# Patient Record
Sex: Female | Born: 1965 | Race: White | Hispanic: No | Marital: Married | Smoking: Never smoker
Health system: Southern US, Community
[De-identification: ages and names within clinical notes are randomized; demographics above are authoritative.]

## PROBLEM LIST (undated history)

## (undated) DIAGNOSIS — I10 Essential (primary) hypertension: Secondary | ICD-10-CM

## (undated) DIAGNOSIS — N939 Abnormal uterine and vaginal bleeding, unspecified: Secondary | ICD-10-CM

## (undated) DIAGNOSIS — G4730 Sleep apnea, unspecified: Secondary | ICD-10-CM

## (undated) DIAGNOSIS — Z973 Presence of spectacles and contact lenses: Secondary | ICD-10-CM

---

## 2015-07-18 HISTORY — PX: OTHER SURGICAL HISTORY: SHX169

## 2017-02-01 ENCOUNTER — Encounter: Payer: Self-pay | Admitting: Family Medicine

## 2017-02-01 ENCOUNTER — Ambulatory Visit (INDEPENDENT_AMBULATORY_CARE_PROVIDER_SITE_OTHER): Payer: BC Managed Care – PPO | Admitting: Family Medicine

## 2017-02-01 DIAGNOSIS — E669 Obesity, unspecified: Secondary | ICD-10-CM | POA: Diagnosis not present

## 2017-02-01 DIAGNOSIS — Z6841 Body Mass Index (BMI) 40.0 and over, adult: Secondary | ICD-10-CM | POA: Diagnosis not present

## 2017-02-01 DIAGNOSIS — IMO0001 Reserved for inherently not codable concepts without codable children: Secondary | ICD-10-CM

## 2017-02-01 NOTE — Progress Notes (Signed)
Medical Nutrition Therapy:  Appt start time: 1330 end time:  1430.  Assessment:  Primary concerns today: Weight management.  Jody Morton is frustrated that she has not been able to lose weight.  She moved here from [place] (originally from [place]) in March 2018.  She works full-time at [employer] related to [employer].     Jody Morton's daughter died 5 yrs ago, and she has had some extended family stresses, which she believes have been a factor in her eating behaviors.  Her 25-YO son who works for Forensic psychologist at [employer]	[place], is very supportive of her efforts to be active and make healthier choices.  She is interested in [facility]'s Living Well: Managing Emotional Eating class that is offered Aug 6.    Learning Readiness: Ready; tries to make good food choices and to be reasonably active.   Usual eating pattern includes 3 meals and 1 snack per day. Frequent foods and beverages include [place], fish, chx, cheese, almond milk, oatmeal (steel cut), coffee, 1 tbslp flvrd crmr.  Avoided foods include milk (prefers alm milk).  Has ~1 alcoholic drink per wk.   Usual physical activity includes 15-20 min swim/water ex at apt pool 2-3 X wk, ~15 min walk 2-3 X wk.  Has sleep apnea; started using a CPAP in Nov 2017, and feels she now does ok for sleep; estimates 7-8 hrs/night.    24-hr recall: (Up at 5 AM) B (6 AM)-   1 c coffee, 1 tbsp flvrd crmr B (8:45 AM)-  1 c overnight oats (<1 c dry), 1 c alm milk, 6 cherries Snk ( AM)-   --- L (12 PM)-  1 chx caesar salad, 1/4 c croutons, water Snk (1:30)-  1 granola bar, water  D (6 PM)-  2 slc pepperoni pizza, water Snk ( PM)-  --- Typical day? Yes.  Except seldom eats pizza.  Most meals are home-cooked.    Progress Towards Goal(s):  In progress.   Nutritional Diagnosis:  NB-2.1 Physical inactivity As related to minimum requirements.  As evidenced by usual activity limited to ~15 min per time, 2-5 X wk.    Intervention:  Nutrition education  Handouts given  during visit include:  AVS  Goals Sheet  Demonstrated degree of understanding via:  Teach Back  Barriers to learning/adherence to lifestyle change: Exercise resistance; hasn't figured out how to work exercise into routine.   Monitoring/Evaluation:  Dietary intake, exercise, and body weight in 4 week(s).

## 2017-02-01 NOTE — Patient Instructions (Addendum)
-   You might benefit from the Living Well: Managing Emotional Eating class that starts on Mon, Aug 6, and meets the 4 Mondays in August.  The class is either from 12-1 PM or 5:30-6:30 PM:  [email or web address]  - Consider using dairy milk in your oats b/c of the protein content.  It's a good idea to get a balance of protein and carb at each meal.    - Suggestion: Decrease amount of oats, and increase (low-glycemic) fruit (e.g., berries, peach).  Goals: 1. 60 min of physical activity 3 times weekly.   2. Obtain twice as many veg's as protein or carbohydrate foods for both lunch and dinner.  Complete your Goals Sheet, and bring to follow-up in 4 weeks.  If you decide to take the class, we will probably want to schedule your appt for after the end of the classes.

## 2017-03-01 ENCOUNTER — Ambulatory Visit: Payer: BC Managed Care – PPO | Admitting: Family Medicine

## 2017-03-06 ENCOUNTER — Ambulatory Visit (INDEPENDENT_AMBULATORY_CARE_PROVIDER_SITE_OTHER): Payer: BC Managed Care – PPO | Admitting: Family Medicine

## 2017-03-06 ENCOUNTER — Encounter: Payer: Self-pay | Admitting: Family Medicine

## 2017-03-06 DIAGNOSIS — IMO0001 Reserved for inherently not codable concepts without codable children: Secondary | ICD-10-CM

## 2017-03-06 DIAGNOSIS — Z6841 Body Mass Index (BMI) 40.0 and over, adult: Secondary | ICD-10-CM | POA: Diagnosis not present

## 2017-03-06 DIAGNOSIS — E669 Obesity, unspecified: Secondary | ICD-10-CM

## 2017-03-06 NOTE — Progress Notes (Signed)
Medical Nutrition Therapy:  Appt start time: 1330 end time:  1430.  Assessment:  Primary concerns today: Weight management.  Jody Morton has been much more physically active, and has been eating more vegetables and fruit more [place] yogurt.  She has been using her Goals Sheet, which she modified to include end-of-week totals to more easily track weekly progress.  She said she feels better, and has surprised herself in how she's been able to get up early for exercise (and is getting to bed earlier).    She inquired about what her weight would need to be for her to be considered in normal BMI range.  I shared my opinion that Jody Morton has her priorities right in placing her outcome goal of improved fitness/stamina over weight loss, as the research suggests fitness is more important to overall health than simply achieving a "normal weight."  Jody Morton's wrist size indicates a very large body frame, and I doubt weight loss to a normal BMI range (145 lb at 64" = BMI of 24.9) is realistic, necessary, or even desirable for her.    It is significant that she is currently motivated to continue efforts at both better food choices and increased physical activity, and seems to be feeling good and enjoying the process.  A narrow focus on weight loss may increase frustration and ultimately derail behavior change efforts.    Jody Morton is still interested in attending [facility]'s emotional eating class, the next one of which starts Sept 10.  Although things are going well right now, she recognizes the role emotional eating sometimes plays in her food choices, and would like to be able to manage this better.    Recent physical activity includes at least 30 min swim/water exercise 3 X wk, 60 min TM and/or stair stepper 3 X wk, and ~20 min walk 3-4 X wk.  24-hr recall:  (Up at 5:30 AM; 60 min TM) Coffee with with 1 tbsp flavored creamer B (8:30 AM)-  1 c Grk yogurt, 1 c berries, 1 tbsp almonds Snk ( AM)-  --- L (12:30 PM)-  2 c  blk beans, 1/2 c corn, let, tom, 4 oz chx, salsa, 1/2 avocado, water Snk ( PM)-  --- D (6 PM)-  2 c grilled veg's, 12 small grilled shrimp, water Snk ( PM)-  --- Typical day? Yes.    Progress Towards Goal(s):  In progress.   Nutritional Diagnosis:  Progress noted on NB-2.1 Physical inactivity As related to minimum requirements.  As evidenced by usual weekly activity of >180 min, with one recent week's exercise of 480 minutes.    Intervention:  Nutrition education  Handouts given during visit include:  AVS  Goals Sheet  Demonstrated degree of understanding via:  Teach Back  Barriers to learning/adherence to lifestyle change: Exercise resistance; hasn't figured out how to work exercise into routine.   Monitoring/Evaluation:  Dietary intake, exercise, and body weight in 8 week(s).

## 2017-03-06 NOTE — Patient Instructions (Addendum)
Look for the Living Well: Managing Emotional Eating class that starts on Mon, Sept 10 at 5:30:  [email or web address].  Search for Living Well: Managing Emotional Eating.  Outcome Goals: 1. Increase stamina to be able to continue to enjoy outdoor activities with few limitations.   2. Reduce at least 1 clothing size / lose some weight.    Behavioral Goals remain the same:  1. 60 min of physical activity 3 times weekly.   2. Obtain twice as many veg's as protein or carbohydrate foods for both lunch and dinner.  Continue to complete Goals Sheet, and bring to follow-up, which we'll schedule for October, following the Emotional Eating class Sept 10-Oct 1.

## 2017-05-08 ENCOUNTER — Ambulatory Visit: Payer: BC Managed Care – PPO | Admitting: Family Medicine

## 2017-08-16 ENCOUNTER — Other Ambulatory Visit: Payer: Self-pay | Admitting: Family Medicine

## 2017-08-16 DIAGNOSIS — N939 Abnormal uterine and vaginal bleeding, unspecified: Secondary | ICD-10-CM

## 2017-08-16 DIAGNOSIS — T8332XA Displacement of intrauterine contraceptive device, initial encounter: Secondary | ICD-10-CM

## 2017-08-23 ENCOUNTER — Ambulatory Visit
Admission: RE | Admit: 2017-08-23 | Discharge: 2017-08-23 | Disposition: A | Discharge status: Home / Self Care | Payer: BC Managed Care – PPO | Source: Ambulatory Visit | Attending: Family Medicine | Admitting: Family Medicine

## 2017-08-23 DIAGNOSIS — N939 Abnormal uterine and vaginal bleeding, unspecified: Secondary | ICD-10-CM

## 2017-08-23 DIAGNOSIS — T8332XA Displacement of intrauterine contraceptive device, initial encounter: Secondary | ICD-10-CM

## 2018-02-28 ENCOUNTER — Other Ambulatory Visit: Payer: Self-pay | Admitting: Obstetrics and Gynecology

## 2018-02-28 ENCOUNTER — Other Ambulatory Visit (HOSPITAL_COMMUNITY)
Admission: RE | Admit: 2018-02-28 | Discharge: 2018-02-28 | Disposition: A | Discharge status: Home / Self Care | Payer: BC Managed Care – PPO | Source: Ambulatory Visit | Attending: Obstetrics and Gynecology | Admitting: Obstetrics and Gynecology

## 2018-02-28 DIAGNOSIS — Z01411 Encounter for gynecological examination (general) (routine) with abnormal findings: Secondary | ICD-10-CM | POA: Insufficient documentation

## 2018-03-04 LAB — CYTOLOGY - PAP
Diagnosis: UNDETERMINED — AB
HPV (WINDOPATH): DETECTED — AB
HPV 16/18/45 GENOTYPING: NEGATIVE

## 2018-07-13 IMAGING — US US PELVIS COMPLETE TRANSABD/TRANSVAG
1 series · 14 of 25 positions shown · non-contrast
Comparison: None

CLINICAL DATA: Vaginal bleeding.  IUD placed 4 years ago.



[Series 1: us pelvis complete transabd/transvag · 0.31mm/px · 55 acquisitions, 14 frames shown]
[im 1/55]
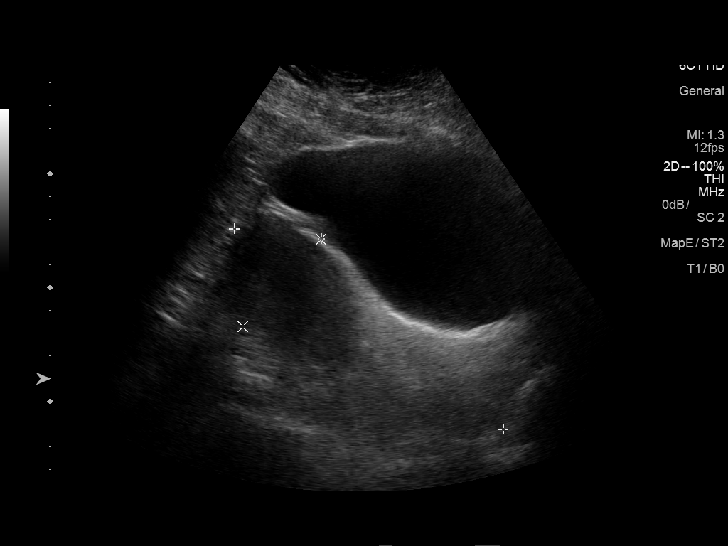
[im 5/55]
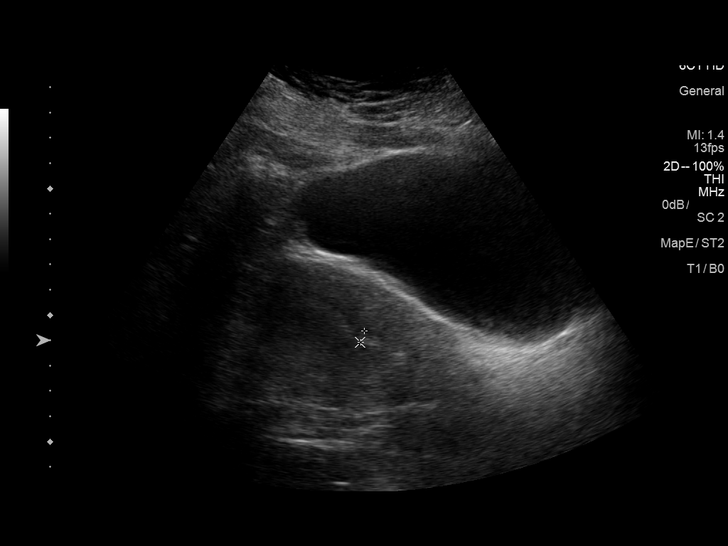
[im 10/55]
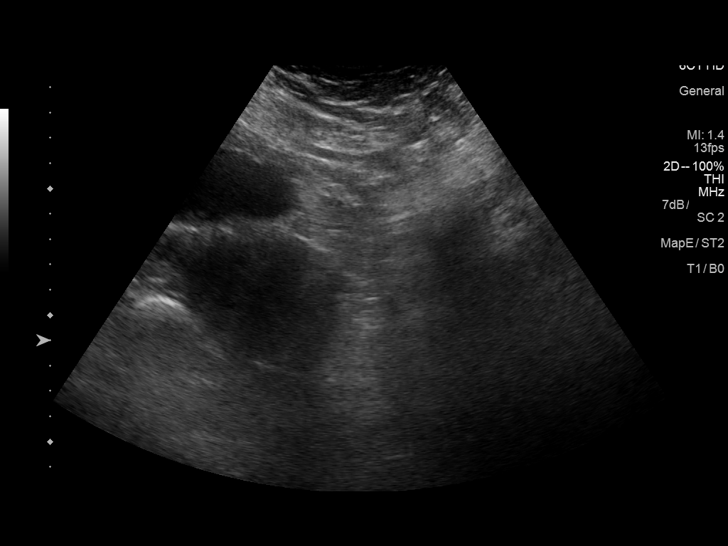
[im 14/55]
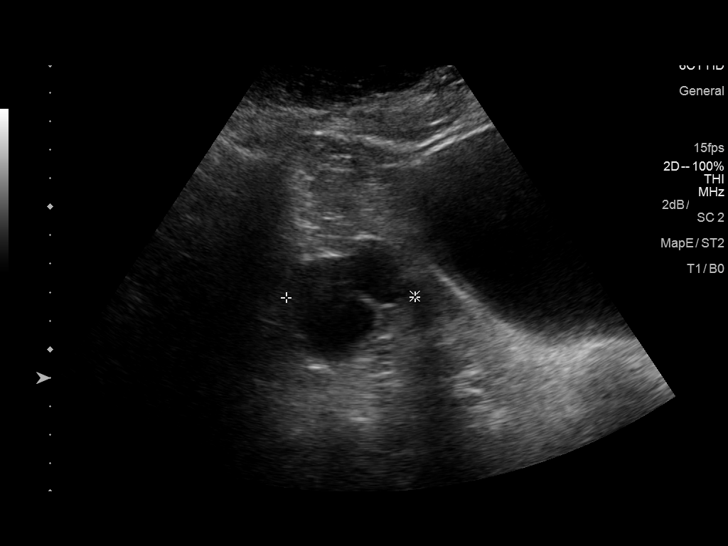
[im 19/55]
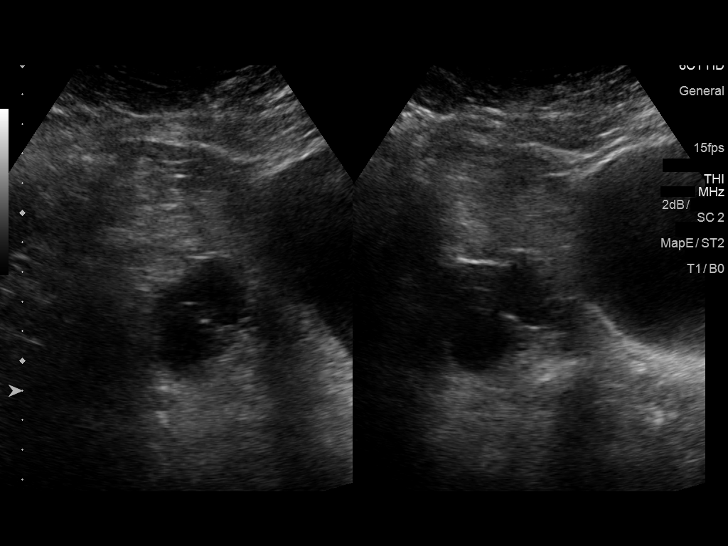
[im 21/55]
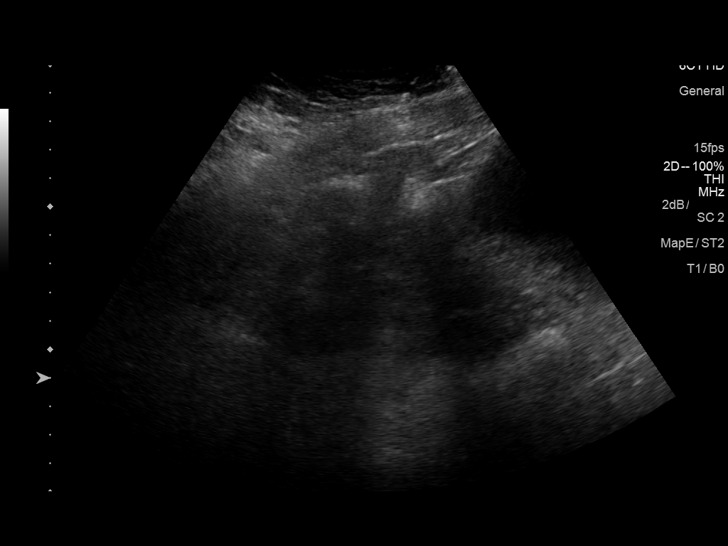
[im 25/55]
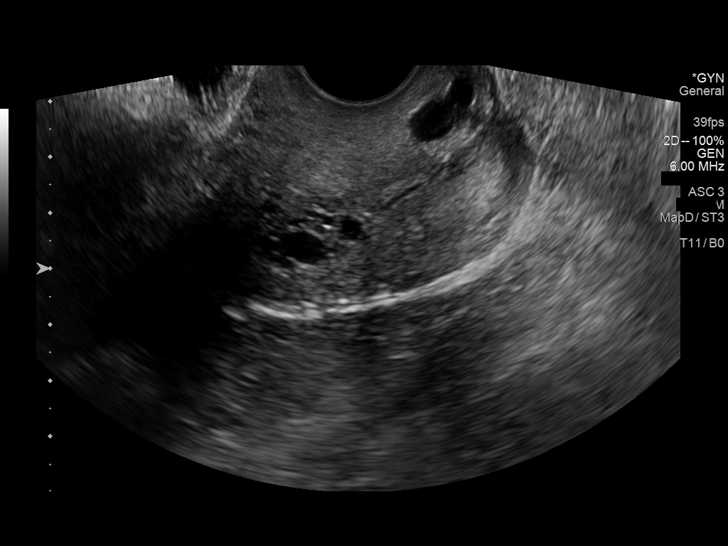
[im 30/55]
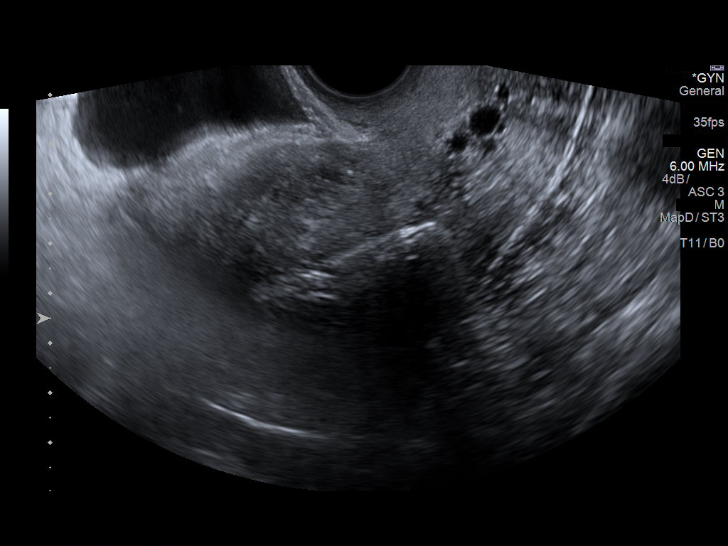
[im 34/55]
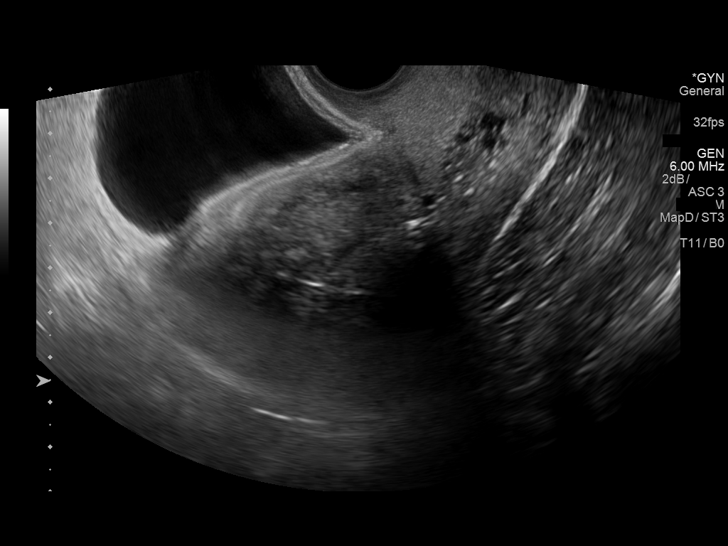
[im 37/55]
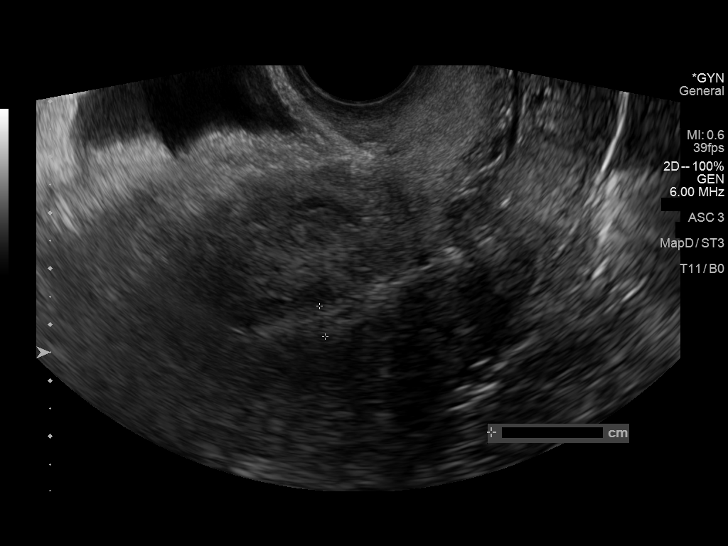
[im 41/55]
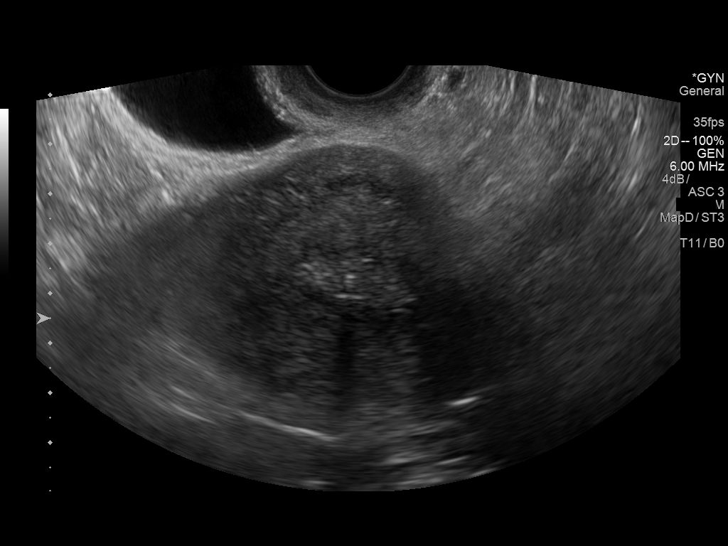
[im 46/55]
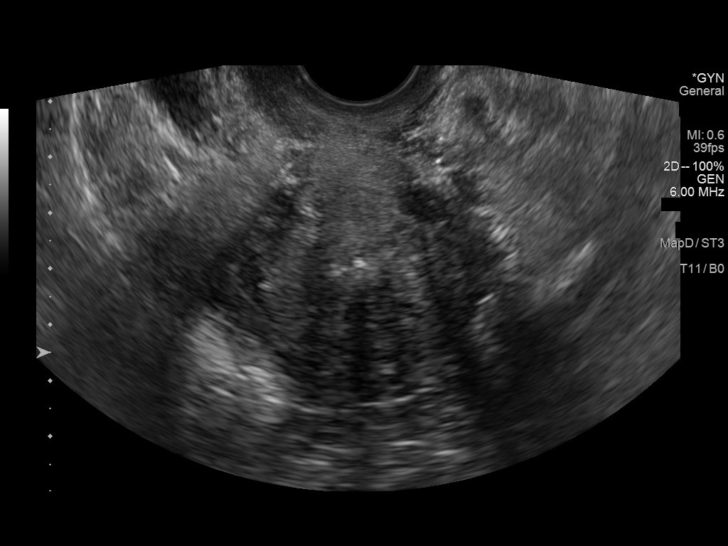
[im 50/55]
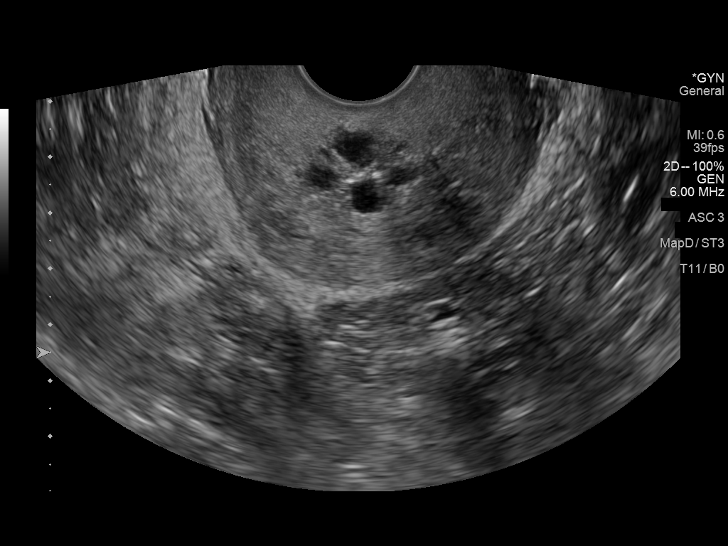
[im 55/55]
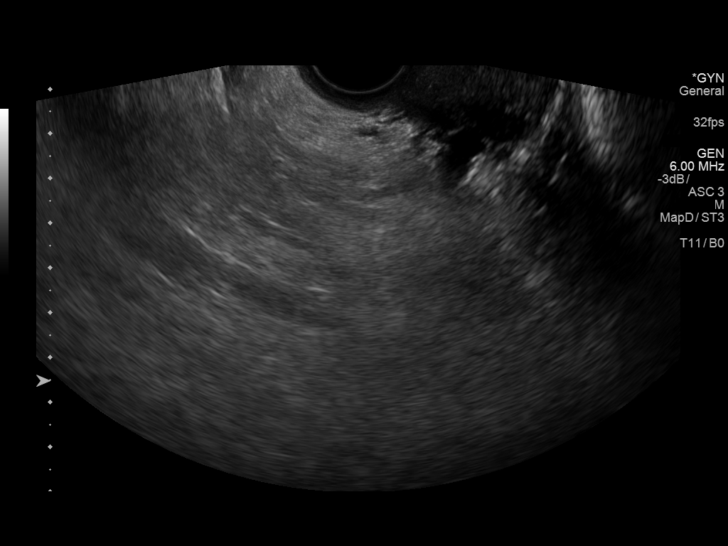

[14 of 25 positions shown; findings below may reference images not displayed]

FINDINGS: Uterus

Measurements: 10.1 x 5.2 x 8.8 cm. No fibroids or other mass
visualized.

Endometrium

Thickness: 5.6 mm. Linear echogenic artifact within the body of the
uterus likely represents an IUD.

Right ovary

Measurements: 4.5 x 4.4 x 5.4 cm. Two cystic structures on the right
ovary measure 2.9 and 2.2 cm in diameter.

Left ovary

Not seen..

Other findings

No abnormal free fluid.
IMPRESSION: Linear echogenic artifact within the endometrial canal has the
appearance of an IUD.

Two right ovarian cysts, the larger measuring 2.9 cm.

Nonvisualization of the left ovary.

## 2020-01-14 ENCOUNTER — Other Ambulatory Visit: Payer: Self-pay | Admitting: Obstetrics and Gynecology

## 2020-01-14 DIAGNOSIS — Z1231 Encounter for screening mammogram for malignant neoplasm of breast: Secondary | ICD-10-CM

## 2020-01-15 ENCOUNTER — Ambulatory Visit
Admission: RE | Admit: 2020-01-15 | Discharge: 2020-01-15 | Disposition: A | Discharge status: Home / Self Care | Payer: BC Managed Care – PPO | Source: Ambulatory Visit | Attending: Obstetrics and Gynecology | Admitting: Obstetrics and Gynecology

## 2020-01-15 ENCOUNTER — Other Ambulatory Visit: Payer: Self-pay

## 2020-01-15 DIAGNOSIS — Z1231 Encounter for screening mammogram for malignant neoplasm of breast: Secondary | ICD-10-CM

## 2020-08-16 NOTE — H&P (Signed)
Jody Morton is an 55 y.o. G2P2 with history of LSO (secondary to left ovarian cyst) and BTL who is admitted for Robotic Assisted Total Laparoscopic Hysterectomy with right salpingo-oophorectomy for abnormal uterine bleeding.  She desires definitive surgical management for her ongoing bleeding.  Has had an IUD in the past but does not desire another one. She was using Provera without success.  Her work-up has included: TVUS (02/13/20):  Uterus 12.20 x 5.63 x 1.64cm, endometrial thickness 1.05cm Fibroid 2.86cm (stable on [place] frodm 01/2019), R ovary 3.50cm, L ovary surgically absent, no free fluid or adnexal masses EMB (01/14/20):  Fragments of proliferative endometrium, focal stromal and glandular breakdown.  No hyperplasia or endometrium. Pap smear (01/14/20):  NILM (-) HRHPV CBC (01/14/2020):  H/H 12.5/37.1 HgbA1c (02/19/20):  5.3  Patient Active Problem List   Diagnosis Date Noted  . Abnormal uterine bleeding (AUB) 08/19/2020  . Obesity 08/19/2020  . History of left salpingo-oophorectomy 08/19/2020  . Hypertension 08/19/2020    MEDICAL/FAMILY/SOCIAL HX: Patient's last menstrual period was 07/06/2020.    Past Medical History:  Diagnosis Date  . Abnormal uterine bleeding (AUB)   . Hypertension   . Sleep apnea    cpcp on auto set  . Wears glasses   r  Past Surgical History:  Procedure Laterality Date  . colonscopy  2017  . tumor removed from ovary  30 yrs ago   benign    History reviewed. No pertinent family history.  Social History:  reports that she has never smoked. She has never used smokeless tobacco. She reports current alcohol use. She reports previous drug use.  ALLERGIES/MEDS:  Allergies:  Allergies  Allergen Reactions  . Tetracyclines & Related Nausea And Vomiting    No medications prior to admission.    Review of Systems  Constitutional: Negative.   HENT: Negative.   Eyes: Negative.   Respiratory: Negative.   Cardiovascular: Negative.    Gastrointestinal: Negative.   Genitourinary: Negative.   Musculoskeletal: Negative.   Skin: Negative.   Neurological: Negative.   Endo/Heme/Allergies: Negative.   Psychiatric/Behavioral: Negative.     Height 5\' 4"  (1.626 m), weight 113.4 kg, last menstrual period 07/06/2020. Gen:  NAD, pleasant and cooperative Cardio:  RRR Pulm:  CTAB, no wheezes/rales/rhonchi Abd:  Soft, non-distended, non-tender throughout Ext:  No bilateral LE edema Pelvic (08/10/20):  Normal-appearing external genitalia, vaginal mucosa pink with rugae, cervix pink, smooth, and without lesions, uterus is non-tender and mildly enlarged, no adnexal tenderness/fullness  No results found for this or any previous visit (from the past 24 hour(s)).  No results found.   ASSESSMENT/PLAN: Jody Morton is a 55 y.o. G2P2 with history of LSO (secondary to left ovarian cyst) and BTL who is admitted for Robotic Assisted Total Laparoscopic Hysterectomy with right salpingo-oophorectomy for abnormal uterine bleeding.  - Admit to [facility] - Admit labs (CBC, T&S, COVID screen) - Diet:  NPO - IVF:  Per anesthesia - VTE Prophylaxis:  SCDs - Antibiotics:  Ancef 2g on call to OR - Anticipate discharge home POD#2  Consents: I have explained to the patient that his surgery is performed to remove the uterus through several small incisions in the abdomen and that it will result in sterility.  I discussed the risks and benefits of the surgery, including, but not limited to bleeding, including the need for blood transfusion, infection, damage to surround organs and tissues, damage to bladder, damage to ureters, causing kidney damage, and requiring additional procedures, damage to bowels, resulting in further surgery, postoperative  pain, short-term and long-term, scarring on the abdominal wall and intra-abdominally, need for further surgery, need for conversion to an open procedure, development of an incisional hernia, deep vein thrombosis,  and/or pulmonary embolism, wound infection and/or separation, painful intercourse, urinary leakage, ovarian failure, resulting in menopausal symptoms requiring treatment, fistula formation, complications the course of which cannot be predicted or prevented, and death. Patient was counseled on prophylactic oophorectomy versus ovarian conservation, understanding that ovarian conservation carries a lifetime risk of ovarian cancer of 1 in 54 and a 5-10% risk of reoperation in the future for ovarian pathology.  She understands that prophylactic oophorectomy may result in menopausal symptoms.  The patient opts for oophorectomy of remaining ovary. Patient was consented for blood products.  The patient is aware that bleeding may result in the need for a blood transfusion which includes risk of transmission of HIV (1:2 million), Hepatitis C (1:2 million), and Hepatitis B (1:200 thousand) and transfusion reaction.  Patient voiced understanding of the above risks as well as understanding of indications for blood transfusion.  [name], DO [phone] (office)

## 2020-08-17 ENCOUNTER — Encounter (HOSPITAL_COMMUNITY): Admission: RE | Admit: 2020-08-17 | Payer: Self-pay | Source: Ambulatory Visit

## 2020-08-18 ENCOUNTER — Other Ambulatory Visit: Payer: Self-pay

## 2020-08-18 ENCOUNTER — Encounter (HOSPITAL_BASED_OUTPATIENT_CLINIC_OR_DEPARTMENT_OTHER): Payer: Self-pay | Admitting: Obstetrics and Gynecology

## 2020-08-18 NOTE — Progress Notes (Signed)
YOU ARE SCHEDULED FOR A COVID TEST  08-23-2020 @ 1105 AM. THIS TEST MUST BE DONE BEFORE SURGERY. GO TO  [place]. [place], [place], IT IS APPROXIMATELY 2 MINUTES PAST [name] ON THE RIGHT AND REMAIN IN YOUR CAR, THIS IS A DRIVE UP TEST. ONCE YOUR COVID TEST IS DONE PLEASE FOLLOW ALL THE QUARANTINE  INSTRUCTIONS GIVEN IN YOUR HANDOUT.      Your procedure is scheduled on  08-25-2020  Report to [facility]   Call this number if you have problems the morning of surgery  :[phone].   OUR ADDRESS IS [address].  WE ARE LOCATED IN [facility].  PLEASE BRING YOUR INSURANCE CARD AND PHOTO ID DAY OF SURGERY.  ONLY ONE PERSON ALLOWED IN FACILITY WAITING AREA.                                     REMEMBER:  DO NOT EAT FOOD, CANDY GUM OR MINTS  AFTER MIDNIGHT . YOU MAY HAVE CLEAR LIQUIDS FROM MIDNIGHT UNTIL 900 AM. NO CLEAR LIQUIDS AFTER  900 AM DAY OF SURGERY.   YOU MAY  BRUSH YOUR TEETH MORNING OF SURGERY AND RINSE YOUR MOUTH OUT, NO CHEWING GUM CANDY OR MINTS.    CLEAR LIQUID DIET   Foods Allowed                                                                     Foods Excluded  Coffee and tea, regular and decaf                             liquids that you cannot  Plain Jell-O any favor except red or purple                                           see through such as: Fruit ices (not with fruit pulp)                                     milk, soups, orange juice  Iced Popsicles                                    All solid food Carbonated beverages, regular and diet                                    Cranberry, grape and apple juices Sports drinks like Gatorade Lightly seasoned clear broth or consume(fat free) Sugar, honey syrup  Sample Menu Breakfast                                Lunch  Supper Cranberry juice                    Beef broth                            Chicken  broth Jell-O                                     Grape juice                           Apple juice Coffee or tea                        Jell-O                                      Popsicle                                                Coffee or tea                        Coffee or tea  _____________________________________________________________________     TAKE THESE MEDICATIONS MORNING OF SURGERY WITH A SIP OF WATER:  PROVERA  BRING CPAP MASK TUBING AND MACHINE  ONE VISITOR IS ALLOWED IN WAITING ROOM ONLY DAY OF SURGERY.  NO VISITOR MAY SPEND THE NIGHT.  VISITOR ARE ALLOWED TO STAY UNTIL 800 PM.                                    DO NOT WEAR JEWERLY, MAKE UP, OR NAIL POLISH ON FINGERNAILS. DO NOT WEAR LOTIONS, POWDERS, PERFUMES OR DEODORANT. DO NOT SHAVE FOR 24 HOURS PRIOR TO DAY OF SURGERY. MEN MAY SHAVE FACE AND NECK. CONTACTS, GLASSES, OR DENTURES MAY NOT BE WORN TO SURGERY.                                    [name] IS NOT RESPONSIBLE  FOR ANY BELONGINGS.                                                                    Marland Kitchen           [name] - Preparing for Surgery Before surgery, you can play an important role.  Because skin is not sterile, your skin needs to be as free of germs as possible.  You can reduce the number of germs on your skin by washing with CHG (chlorahexidine gluconate) soap before surgery.  CHG is an antiseptic cleaner which kills germs and bonds with the skin to continue killing germs even after washing. Please DO NOT use if you have an allergy  to CHG or antibacterial soaps.  If your skin becomes reddened/irritated stop using the CHG and inform your nurse when you arrive at Short Stay. Do not shave (including legs and underarms) for at least 48 hours prior to the first CHG shower.  You may shave your face/neck. Please follow these instructions carefully:  1.  Shower with CHG Soap the night before surgery and the  morning of Surgery.  2.  If you choose to  wash your hair, wash your hair first as usual with your  normal  shampoo.  3.  After you shampoo, rinse your hair and body thoroughly to remove the  shampoo.                           4.  Use CHG as you would any other liquid soap.  You can apply chg directly  to the skin and wash                       Gently with a scrungie or clean washcloth.  5.  Apply the CHG Soap to your body ONLY FROM THE NECK DOWN.   Do not use on face/ open                           Wound or open sores. Avoid contact with eyes, ears mouth and genitals (private parts).                       Wash face,  Genitals (private parts) with your normal soap.             6.  Wash thoroughly, paying special attention to the area where your surgery  will be performed.  7.  Thoroughly rinse your body with warm water from the neck down.  8.  DO NOT shower/wash with your normal soap after using and rinsing off  the CHG Soap.                9.  Pat yourself dry with a clean towel.            10.  Wear clean pajamas.            11.  Place clean sheets on your bed the night of your first shower and do not  sleep with pets. Day of Surgery : Do not apply any lotions/deodorants the morning of surgery.  Please wear clean clothes to the hospital/surgery center.  FAILURE TO FOLLOW THESE INSTRUCTIONS MAY RESULT IN THE CANCELLATION OF YOUR SURGERY PATIENT SIGNATURE_________________________________  NURSE SIGNATURE__________________________________  ________________________________________________________________________                                                        QUESTIONS Jody Morton PRE OP NURSE PHONE [phone]

## 2020-08-18 NOTE — Progress Notes (Addendum)
Spoke w/ via phone for pre-op interview---pt Lab needs dos----   Urine poct    T & S        Lab results------lab appt 08-23-2020 1000 for cbc bmp chest xray and ekg COVID test ------08-23-2020 1100 Arrive at -------1000 am 08-25-2020 NPO after MN NO Solid Food.  Clear liquids from MN until---900 am then npo Medications to take morning of surgery -----provera Diabetic medication -----n/a Patient Special Instructions -----pt given overnight stay instructions Pre-Op special Istructions -----bring cpap mask tubing and machine day of surgery Patient verbalized understanding of instructions that were given at this phone interview. Patient denies shortness of breath, chest pain, fever, cough at this phone interview.  PT HAS NOSE RING WILL REMOVE  Needs T & S redone day of surgery due to antibodies per lab beth, order placed in epic

## 2020-08-19 DIAGNOSIS — N939 Abnormal uterine and vaginal bleeding, unspecified: Secondary | ICD-10-CM

## 2020-08-19 DIAGNOSIS — I10 Essential (primary) hypertension: Secondary | ICD-10-CM

## 2020-08-19 DIAGNOSIS — Z90721 Acquired absence of ovaries, unilateral: Secondary | ICD-10-CM

## 2020-08-19 DIAGNOSIS — E669 Obesity, unspecified: Secondary | ICD-10-CM

## 2020-08-23 ENCOUNTER — Other Ambulatory Visit (HOSPITAL_COMMUNITY)
Admission: RE | Admit: 2020-08-23 | Discharge: 2020-08-23 | Disposition: A | Discharge status: Home / Self Care | Payer: BC Managed Care – PPO | Source: Ambulatory Visit | Attending: Obstetrics and Gynecology | Admitting: Obstetrics and Gynecology

## 2020-08-23 ENCOUNTER — Ambulatory Visit (HOSPITAL_COMMUNITY)
Admission: RE | Admit: 2020-08-23 | Discharge: 2020-08-23 | Disposition: A | Discharge status: Home / Self Care | Payer: BC Managed Care – PPO | Source: Ambulatory Visit | Attending: Obstetrics and Gynecology | Admitting: Obstetrics and Gynecology

## 2020-08-23 ENCOUNTER — Other Ambulatory Visit: Payer: Self-pay

## 2020-08-23 ENCOUNTER — Encounter (HOSPITAL_COMMUNITY)
Admission: RE | Admit: 2020-08-23 | Discharge: 2020-08-23 | Disposition: A | Discharge status: Home / Self Care | Payer: BC Managed Care – PPO | Source: Ambulatory Visit | Attending: Obstetrics and Gynecology | Admitting: Obstetrics and Gynecology

## 2020-08-23 DIAGNOSIS — N870 Mild cervical dysplasia: Secondary | ICD-10-CM | POA: Diagnosis not present

## 2020-08-23 DIAGNOSIS — Z881 Allergy status to other antibiotic agents status: Secondary | ICD-10-CM | POA: Diagnosis not present

## 2020-08-23 DIAGNOSIS — Z01818 Encounter for other preprocedural examination: Secondary | ICD-10-CM | POA: Diagnosis not present

## 2020-08-23 DIAGNOSIS — D259 Leiomyoma of uterus, unspecified: Secondary | ICD-10-CM | POA: Diagnosis not present

## 2020-08-23 DIAGNOSIS — Z01810 Encounter for preprocedural cardiovascular examination: Secondary | ICD-10-CM

## 2020-08-23 DIAGNOSIS — N939 Abnormal uterine and vaginal bleeding, unspecified: Secondary | ICD-10-CM | POA: Diagnosis present

## 2020-08-23 DIAGNOSIS — Z20822 Contact with and (suspected) exposure to covid-19: Secondary | ICD-10-CM | POA: Insufficient documentation

## 2020-08-23 LAB — CBC
HCT: 39.6 % (ref 36.0–46.0)
Hemoglobin: 12.2 g/dL (ref 12.0–15.0)
MCH: 25.7 pg — ABNORMAL LOW (ref 26.0–34.0)
MCHC: 30.8 g/dL (ref 30.0–36.0)
MCV: 83.5 fL (ref 80.0–100.0)
Platelets: 297 10*3/uL (ref 150–400)
RBC: 4.74 MIL/uL (ref 3.87–5.11)
RDW: 14.8 % (ref 11.5–15.5)
WBC: 7.2 10*3/uL (ref 4.0–10.5)
nRBC: 0 % (ref 0.0–0.2)

## 2020-08-23 LAB — BASIC METABOLIC PANEL
Anion gap: 11 (ref 5–15)
BUN: 17 mg/dL (ref 6–20)
CO2: 24 mmol/L (ref 22–32)
Calcium: 9.4 mg/dL (ref 8.9–10.3)
Chloride: 104 mmol/L (ref 98–111)
Creatinine, Ser: 0.68 mg/dL (ref 0.44–1.00)
GFR, Estimated: >60 mL/min (ref >60)
Glucose, Bld: 115 mg/dL — ABNORMAL HIGH (ref 70–99)
Potassium: 4 mmol/L (ref 3.5–5.1)
Sodium: 139 mmol/L (ref 135–145)

## 2020-08-23 LAB — SARS CORONAVIRUS 2 (TAT 6-24 HRS): SARS Coronavirus 2: NEGATIVE

## 2020-08-25 ENCOUNTER — Ambulatory Visit (HOSPITAL_BASED_OUTPATIENT_CLINIC_OR_DEPARTMENT_OTHER)
Admission: RE | Admit: 2020-08-25 | Discharge: 2020-08-26 | Disposition: A | Discharge status: Home / Self Care | Payer: BC Managed Care – PPO | Attending: Obstetrics and Gynecology | Admitting: Obstetrics and Gynecology

## 2020-08-25 ENCOUNTER — Encounter (HOSPITAL_BASED_OUTPATIENT_CLINIC_OR_DEPARTMENT_OTHER): Payer: Self-pay | Admitting: Obstetrics and Gynecology

## 2020-08-25 ENCOUNTER — Ambulatory Visit (HOSPITAL_BASED_OUTPATIENT_CLINIC_OR_DEPARTMENT_OTHER): Payer: BC Managed Care – PPO | Admitting: Anesthesiology

## 2020-08-25 ENCOUNTER — Encounter (HOSPITAL_BASED_OUTPATIENT_CLINIC_OR_DEPARTMENT_OTHER)
Admission: RE | Disposition: A | Discharge status: Home / Self Care | Payer: Self-pay | Source: Home / Self Care | Attending: Obstetrics and Gynecology

## 2020-08-25 ENCOUNTER — Other Ambulatory Visit: Payer: Self-pay

## 2020-08-25 DIAGNOSIS — Z20822 Contact with and (suspected) exposure to covid-19: Secondary | ICD-10-CM | POA: Insufficient documentation

## 2020-08-25 DIAGNOSIS — D259 Leiomyoma of uterus, unspecified: Secondary | ICD-10-CM | POA: Insufficient documentation

## 2020-08-25 DIAGNOSIS — Z881 Allergy status to other antibiotic agents status: Secondary | ICD-10-CM | POA: Diagnosis not present

## 2020-08-25 DIAGNOSIS — N939 Abnormal uterine and vaginal bleeding, unspecified: Secondary | ICD-10-CM

## 2020-08-25 DIAGNOSIS — N870 Mild cervical dysplasia: Secondary | ICD-10-CM | POA: Insufficient documentation

## 2020-08-25 DIAGNOSIS — Z90721 Acquired absence of ovaries, unilateral: Secondary | ICD-10-CM

## 2020-08-25 DIAGNOSIS — I10 Essential (primary) hypertension: Secondary | ICD-10-CM

## 2020-08-25 DIAGNOSIS — E669 Obesity, unspecified: Secondary | ICD-10-CM

## 2020-08-25 DIAGNOSIS — Z9079 Acquired absence of other genital organ(s): Secondary | ICD-10-CM

## 2020-08-25 HISTORY — DX: Presence of spectacles and contact lenses: Z97.3

## 2020-08-25 HISTORY — PX: ROBOTIC ASSISTED TOTAL HYSTERECTOMY WITH BILATERAL SALPINGO OOPHERECTOMY: SHX6086

## 2020-08-25 HISTORY — DX: Abnormal uterine and vaginal bleeding, unspecified: N93.9

## 2020-08-25 HISTORY — DX: Sleep apnea, unspecified: G47.30

## 2020-08-25 HISTORY — DX: Essential (primary) hypertension: I10

## 2020-08-25 LAB — ABO/RH: ABO/RH(D): A POS

## 2020-08-25 LAB — POCT PREGNANCY, URINE: Preg Test, Ur: NEGATIVE

## 2020-08-25 SURGERY — HYSTERECTOMY, TOTAL, ROBOT-ASSISTED, LAPAROSCOPIC, WITH BILATERAL SALPINGO-OOPHORECTOMY
Anesthesia: General | Site: Abdomen

## 2020-08-25 MED ORDER — LIDOCAINE HCL (PF) 2 % IJ SOLN
INTRAMUSCULAR | Status: AC
  Filled 2020-08-25: qty 20

## 2020-08-25 MED ORDER — KETOROLAC TROMETHAMINE 30 MG/ML IJ SOLN
30.0000 mg | Freq: Once | INTRAMUSCULAR | Status: AC
  Administered 2020-08-25: 30 mg via INTRAVENOUS

## 2020-08-25 MED ORDER — LIDOCAINE 2% (20 MG/ML) 5 ML SYRINGE
INTRAMUSCULAR | Status: DC | PRN
  Administered 2020-08-25: 1.5 mg/kg/h via INTRAVENOUS
  Administered 2020-08-25: 60 mg via INTRAVENOUS

## 2020-08-25 MED ORDER — ONDANSETRON HCL 4 MG/2ML IJ SOLN: 4.0000 mg | Freq: Four times a day (QID) | INTRAMUSCULAR | Status: DC | PRN

## 2020-08-25 MED ORDER — ROCURONIUM BROMIDE 10 MG/ML (PF) SYRINGE
PREFILLED_SYRINGE | INTRAVENOUS | Status: AC
  Filled 2020-08-25: qty 20

## 2020-08-25 MED ORDER — FENTANYL CITRATE (PF) 100 MCG/2ML IJ SOLN
INTRAMUSCULAR | Status: DC | PRN
  Administered 2020-08-25: 100 ug via INTRAVENOUS
  Administered 2020-08-25 (×3): 50 ug via INTRAVENOUS

## 2020-08-25 MED ORDER — MORPHINE SULFATE (PF) 4 MG/ML IV SOLN: 1.0000 mg | INTRAVENOUS | Status: DC | PRN

## 2020-08-25 MED ORDER — SCOPOLAMINE 1 MG/3DAYS TD PT72
MEDICATED_PATCH | TRANSDERMAL | Status: AC
  Filled 2020-08-25: qty 1

## 2020-08-25 MED ORDER — CELECOXIB 200 MG PO CAPS
400.0000 mg | ORAL_CAPSULE | Freq: Once | ORAL | Status: AC
  Administered 2020-08-25: 400 mg via ORAL

## 2020-08-25 MED ORDER — LACTATED RINGERS IV SOLN
INTRAVENOUS | Status: DC
  Administered 2020-08-25: 50 mL via INTRAVENOUS

## 2020-08-25 MED ORDER — ACETAMINOPHEN 500 MG PO TABS: 1000.0000 mg | ORAL_TABLET | Freq: Four times a day (QID) | ORAL | Status: DC | PRN

## 2020-08-25 MED ORDER — CEFAZOLIN SODIUM-DEXTROSE 2-4 GM/100ML-% IV SOLN
INTRAVENOUS | Status: AC
  Filled 2020-08-25: qty 100

## 2020-08-25 MED ORDER — SODIUM CHLORIDE 0.9 % IR SOLN
Status: DC | PRN
  Administered 2020-08-25: 1000 mL

## 2020-08-25 MED ORDER — ROCURONIUM BROMIDE 10 MG/ML (PF) SYRINGE
PREFILLED_SYRINGE | INTRAVENOUS | Status: AC
  Filled 2020-08-25: qty 10

## 2020-08-25 MED ORDER — ARTIFICIAL TEARS OPHTHALMIC OINT
TOPICAL_OINTMENT | OPHTHALMIC | Status: AC
  Filled 2020-08-25: qty 3.5

## 2020-08-25 MED ORDER — KETAMINE HCL 10 MG/ML IJ SOLN
INTRAMUSCULAR | Status: DC | PRN
  Administered 2020-08-25: 50 mg via INTRAVENOUS

## 2020-08-25 MED ORDER — LACTATED RINGERS IV SOLN: INTRAVENOUS | Status: DC

## 2020-08-25 MED ORDER — PANTOPRAZOLE SODIUM 40 MG IV SOLR
INTRAVENOUS | Status: AC
  Filled 2020-08-25: qty 40

## 2020-08-25 MED ORDER — DOCUSATE SODIUM 100 MG PO CAPS: 100.0000 mg | ORAL_CAPSULE | Freq: Two times a day (BID) | ORAL | Status: DC | PRN

## 2020-08-25 MED ORDER — PROPOFOL 10 MG/ML IV BOLUS
INTRAVENOUS | Status: DC | PRN
  Administered 2020-08-25: 20 mg via INTRAVENOUS
  Administered 2020-08-25: 150 mg via INTRAVENOUS

## 2020-08-25 MED ORDER — SIMETHICONE 80 MG PO CHEW: 80.0000 mg | CHEWABLE_TABLET | Freq: Four times a day (QID) | ORAL | Status: DC | PRN

## 2020-08-25 MED ORDER — ONDANSETRON HCL 4 MG/2ML IJ SOLN
INTRAMUSCULAR | Status: AC
  Filled 2020-08-25: qty 2

## 2020-08-25 MED ORDER — MIDAZOLAM HCL 2 MG/2ML IJ SOLN
INTRAMUSCULAR | Status: AC
  Filled 2020-08-25: qty 2

## 2020-08-25 MED ORDER — PHENYLEPHRINE 40 MCG/ML (10ML) SYRINGE FOR IV PUSH (FOR BLOOD PRESSURE SUPPORT)
PREFILLED_SYRINGE | INTRAVENOUS | Status: DC | PRN
  Administered 2020-08-25: 80 ug via INTRAVENOUS
  Administered 2020-08-25: 160 ug via INTRAVENOUS
  Administered 2020-08-25: 80 ug via INTRAVENOUS

## 2020-08-25 MED ORDER — ROCURONIUM BROMIDE 10 MG/ML (PF) SYRINGE
PREFILLED_SYRINGE | INTRAVENOUS | Status: DC | PRN
  Administered 2020-08-25: 20 mg via INTRAVENOUS
  Administered 2020-08-25: 60 mg via INTRAVENOUS
  Administered 2020-08-25 (×3): 20 mg via INTRAVENOUS

## 2020-08-25 MED ORDER — ONDANSETRON HCL 4 MG/2ML IJ SOLN
INTRAMUSCULAR | Status: DC | PRN
  Administered 2020-08-25 (×2): 4 mg via INTRAVENOUS

## 2020-08-25 MED ORDER — CELECOXIB 200 MG PO CAPS
ORAL_CAPSULE | ORAL | Status: AC
  Filled 2020-08-25: qty 2

## 2020-08-25 MED ORDER — CELECOXIB 200 MG PO CAPS: 200.0000 mg | ORAL_CAPSULE | Freq: Once | ORAL | Status: AC

## 2020-08-25 MED ORDER — ACETAMINOPHEN 500 MG PO TABS: 1000.0000 mg | ORAL_TABLET | Freq: Once | ORAL | Status: AC

## 2020-08-25 MED ORDER — FENTANYL CITRATE (PF) 250 MCG/5ML IJ SOLN
INTRAMUSCULAR | Status: AC
  Filled 2020-08-25: qty 5

## 2020-08-25 MED ORDER — ACETAMINOPHEN 500 MG PO TABS
1000.0000 mg | ORAL_TABLET | Freq: Once | ORAL | Status: AC
  Administered 2020-08-25: 1000 mg via ORAL

## 2020-08-25 MED ORDER — EPHEDRINE 5 MG/ML INJ
INTRAVENOUS | Status: AC
  Filled 2020-08-25: qty 10

## 2020-08-25 MED ORDER — KETAMINE HCL 10 MG/ML IJ SOLN
INTRAMUSCULAR | Status: AC
  Filled 2020-08-25: qty 1

## 2020-08-25 MED ORDER — PHENYLEPHRINE 40 MCG/ML (10ML) SYRINGE FOR IV PUSH (FOR BLOOD PRESSURE SUPPORT)
PREFILLED_SYRINGE | INTRAVENOUS | Status: AC
  Filled 2020-08-25: qty 10

## 2020-08-25 MED ORDER — EPHEDRINE SULFATE-NACL 50-0.9 MG/10ML-% IV SOSY
PREFILLED_SYRINGE | INTRAVENOUS | Status: DC | PRN
  Administered 2020-08-25: 15 mg via INTRAVENOUS

## 2020-08-25 MED ORDER — ALBUMIN HUMAN 5 % IV SOLN: INTRAVENOUS | Status: DC | PRN

## 2020-08-25 MED ORDER — CEFAZOLIN SODIUM-DEXTROSE 2-4 GM/100ML-% IV SOLN
2.0000 g | INTRAVENOUS | Status: AC
  Administered 2020-08-25: 2 g via INTRAVENOUS

## 2020-08-25 MED ORDER — AMISULPRIDE (ANTIEMETIC) 5 MG/2ML IV SOLN: 10.0000 mg | Freq: Once | INTRAVENOUS | Status: DC | PRN

## 2020-08-25 MED ORDER — MIDAZOLAM HCL 2 MG/2ML IJ SOLN
INTRAMUSCULAR | Status: DC | PRN
  Administered 2020-08-25: 2 mg via INTRAVENOUS

## 2020-08-25 MED ORDER — ACETAMINOPHEN 500 MG PO TABS
ORAL_TABLET | ORAL | Status: AC
  Filled 2020-08-25: qty 2

## 2020-08-25 MED ORDER — IBUPROFEN 200 MG PO TABS
100.0000 mg | ORAL_TABLET | Freq: Three times a day (TID) | ORAL | Status: DC
  Administered 2020-08-26: 100 mg via ORAL

## 2020-08-25 MED ORDER — ONDANSETRON HCL 4 MG PO TABS: 4.0000 mg | ORAL_TABLET | Freq: Four times a day (QID) | ORAL | Status: DC | PRN

## 2020-08-25 MED ORDER — DEXAMETHASONE SODIUM PHOSPHATE 10 MG/ML IJ SOLN
INTRAMUSCULAR | Status: DC | PRN
  Administered 2020-08-25: 10 mg via INTRAVENOUS

## 2020-08-25 MED ORDER — DEXAMETHASONE SODIUM PHOSPHATE 10 MG/ML IJ SOLN
INTRAMUSCULAR | Status: AC
  Filled 2020-08-25: qty 1

## 2020-08-25 MED ORDER — SODIUM CHLORIDE 0.9 % IV SOLN
INTRAVENOUS | Status: DC | PRN
  Administered 2020-08-25: 20 mL

## 2020-08-25 MED ORDER — PROPOFOL 10 MG/ML IV BOLUS
INTRAVENOUS | Status: AC
  Filled 2020-08-25: qty 20

## 2020-08-25 MED ORDER — KETOROLAC TROMETHAMINE 30 MG/ML IJ SOLN
INTRAMUSCULAR | Status: AC
  Filled 2020-08-25: qty 1

## 2020-08-25 MED ORDER — SUGAMMADEX SODIUM 200 MG/2ML IV SOLN
INTRAVENOUS | Status: DC | PRN
  Administered 2020-08-25: 200 mg via INTRAVENOUS

## 2020-08-25 MED ORDER — OXYCODONE HCL 5 MG PO TABS
ORAL_TABLET | ORAL | Status: AC
  Filled 2020-08-25: qty 1

## 2020-08-25 MED ORDER — SCOPOLAMINE 1 MG/3DAYS TD PT72
1.0000 | MEDICATED_PATCH | TRANSDERMAL | Status: DC
  Administered 2020-08-25: 1.5 mg via TRANSDERMAL

## 2020-08-25 MED ORDER — OXYCODONE HCL 5 MG PO TABS
5.0000 mg | ORAL_TABLET | ORAL | Status: DC | PRN
  Administered 2020-08-25 – 2020-08-26 (×3): 5 mg via ORAL

## 2020-08-25 MED ORDER — PANTOPRAZOLE SODIUM 40 MG IV SOLR
40.0000 mg | Freq: Every day | INTRAVENOUS | Status: DC
  Administered 2020-08-25: 40 mg via INTRAVENOUS

## 2020-08-25 MED ORDER — FENTANYL CITRATE (PF) 100 MCG/2ML IJ SOLN: 25.0000 ug | INTRAMUSCULAR | Status: DC | PRN

## 2020-08-25 SURGICAL SUPPLY — 68 items
ADH SKN CLS APL DERMABOND .7 (GAUZE/BANDAGES/DRESSINGS) ×1
APL SRG 38 LTWT LNG FL B (MISCELLANEOUS) ×1
APPLICATOR ARISTA FLEXITIP XL (MISCELLANEOUS) ×2 IMPLANT
BARRIER ADHS 3X4 INTERCEED (GAUZE/BANDAGES/DRESSINGS) IMPLANT
BRR ADH 4X3 ABS CNTRL BYND (GAUZE/BANDAGES/DRESSINGS)
CATH FOLEY 3WAY  5CC 16FR (CATHETERS) ×1
CATH FOLEY 3WAY 5CC 16FR (CATHETERS) ×1 IMPLANT
CELLS DAT CNTRL 66122 CELL SVR (MISCELLANEOUS) IMPLANT
COVER BACK TABLE 60X90IN (DRAPES) ×2 IMPLANT
COVER TIP SHEARS 8 DVNC (MISCELLANEOUS) ×1 IMPLANT
COVER TIP SHEARS 8MM DA VINCI (MISCELLANEOUS) ×1
DECANTER SPIKE VIAL GLASS SM (MISCELLANEOUS) ×6 IMPLANT
DEFOGGER SCOPE WARMER CLEARIFY (MISCELLANEOUS) ×2 IMPLANT
DERMABOND ADVANCED (GAUZE/BANDAGES/DRESSINGS) ×1
DERMABOND ADVANCED .7 DNX12 (GAUZE/BANDAGES/DRESSINGS) ×1 IMPLANT
DILATOR CANAL MILEX (MISCELLANEOUS) ×2 IMPLANT
DRAPE ARM DVNC X/XI (DISPOSABLE) ×4 IMPLANT
DRAPE COLUMN DVNC XI (DISPOSABLE) ×1 IMPLANT
DRAPE DA VINCI XI ARM (DISPOSABLE) ×4
DRAPE DA VINCI XI COLUMN (DISPOSABLE) ×1
DRAPE UTILITY 15X26 TOWEL STRL (DRAPES) ×2 IMPLANT
DRAPE UTILITY XL STRL (DRAPES) ×2 IMPLANT
DURAPREP 26ML APPLICATOR (WOUND CARE) ×2 IMPLANT
ELECT REM PT RETURN 9FT ADLT (ELECTROSURGICAL) ×2
ELECTRODE REM PT RTRN 9FT ADLT (ELECTROSURGICAL) ×1 IMPLANT
GLOVE BIOGEL M 6.5 STRL (GLOVE) ×6 IMPLANT
GLOVE SURG UNDER POLY LF SZ6.5 (GLOVE) ×12 IMPLANT
HEMOSTAT ARISTA ABSORB 3G PWDR (HEMOSTASIS) ×2 IMPLANT
HOLDER FOLEY CATH W/STRAP (MISCELLANEOUS) IMPLANT
IRRIG SUCT STRYKERFLOW 2 WTIP (MISCELLANEOUS) ×2
IRRIGATION SUCT STRKRFLW 2 WTP (MISCELLANEOUS) ×1 IMPLANT
KIT TURNOVER CYSTO (KITS) ×2 IMPLANT
LEGGING LITHOTOMY PAIR STRL (DRAPES) ×2 IMPLANT
OBTURATOR OPTICAL STANDARD 8MM (TROCAR)
OBTURATOR OPTICAL STND 8 DVNC (TROCAR)
OBTURATOR OPTICALSTD 8 DVNC (TROCAR) IMPLANT
OCCLUDER COLPOPNEUMO (BALLOONS) ×2 IMPLANT
PACK ROBOT WH (CUSTOM PROCEDURE TRAY) ×2 IMPLANT
PACK ROBOTIC GOWN (GOWN DISPOSABLE) ×2 IMPLANT
PACK TRENDGUARD 450 HYBRID PRO (MISCELLANEOUS) IMPLANT
PAD OB MATERNITY 4.3X12.25 (PERSONAL CARE ITEMS) ×2 IMPLANT
PAD PREP 24X48 CUFFED NSTRL (MISCELLANEOUS) ×2 IMPLANT
PROTECTOR NERVE ULNAR (MISCELLANEOUS) ×2 IMPLANT
RTRCTR WOUND ALEXIS 18CM MED (MISCELLANEOUS)
RTRCTR WOUND ALEXIS 18CM SML (INSTRUMENTS)
SAVER CELL AAL HAEMONETICS (INSTRUMENTS) IMPLANT
SCISSORS LAP 5X45 EPIX DISP (ENDOMECHANICALS) IMPLANT
SEAL CANN UNIV 5-8 DVNC XI (MISCELLANEOUS) ×4 IMPLANT
SEAL XI 5MM-8MM UNIVERSAL (MISCELLANEOUS) ×4
SEALER VESSEL DA VINCI XI (MISCELLANEOUS)
SEALER VESSEL EXT DVNC XI (MISCELLANEOUS) IMPLANT
SET IRRIG Y TYPE TUR BLADDER L (SET/KITS/TRAYS/PACK) IMPLANT
SET TRI-LUMEN FLTR TB AIRSEAL (TUBING) ×2 IMPLANT
SUT VIC AB 0 CT1 27 (SUTURE) ×4
SUT VIC AB 0 CT1 27XBRD ANBCTR (SUTURE) ×2 IMPLANT
SUT VICRYL 0 UR6 27IN ABS (SUTURE) IMPLANT
SUT VICRYL RAPIDE 4/0 PS 2 (SUTURE) ×6 IMPLANT
SUT VLOC 180 0 9IN  GS21 (SUTURE) ×1
SUT VLOC 180 0 9IN GS21 (SUTURE) ×1 IMPLANT
TIP RUMI ORANGE 6.7MMX12CM (TIP) IMPLANT
TIP UTERINE 5.1X6CM LAV DISP (MISCELLANEOUS) IMPLANT
TIP UTERINE 6.7X10CM GRN DISP (MISCELLANEOUS) IMPLANT
TIP UTERINE 6.7X6CM WHT DISP (MISCELLANEOUS) IMPLANT
TIP UTERINE 6.7X8CM BLUE DISP (MISCELLANEOUS) ×2 IMPLANT
TOWEL OR 17X26 10 PK STRL BLUE (TOWEL DISPOSABLE) ×2 IMPLANT
TRENDGUARD 450 HYBRID PRO PACK (MISCELLANEOUS)
TROCAR PORT AIRSEAL 8X120 (TROCAR) ×2 IMPLANT
WATER STERILE IRR 1000ML POUR (IV SOLUTION) ×2 IMPLANT

## 2020-08-25 NOTE — Op Note (Addendum)
Pre Op Dx:  Abnormal uterine bleeding, Uterine fibroid Post Op Dx:  Same as pre-op Procedure:  Robotic Assisted Total Laparoscopic Hysterectomy with Right Salpingo-oophorectomy   Surgeon:  Dr. [name] Assistants:  Dr. Christophe Louis Anesthesia:  General   EBL:  100cc  IVF:  800cc crystalloid and 250cc albumin UOP:  300cc   Drains:  Foley catheter Specimen removed:  Uterus (greater than 250g), cervix, right fallopian tube and ovary - all sent to pathology  Device(s) implanted: None Case Type:  Clean-contaminated Findings:  Uterus with broad fundus and a ~3cm anterior fibroid present with a few smaller-appearing sub-centimeter fibroids.  Normal-appearing right fallopian tube and ovary.  Surgical absence of left fallopian tube and ovary. Adhesions of uterus to left pelvic sidewall. Falope rings noted bilaterally - right side removed with specimen.  Left falope ring scarred into left peritoneum and left in place. Bilateral ureters were visualized and peristalsis was noted. Complications: None Indications:  55 y.o. G2P2 with history of LSO (secondary to left ovarian cyst) and BTL with AUB refractory to medical management who desired definitive surgical management. Given her age, she opted for removal of remaining ovary. Description of each procedure:  After informed consent the patient was taken to the operating room and placed in dorsal supine position where general endotracheal anesthesia was administered and found to be adequate.  She was placed in dorsal lithotomy position with her arms tucked.  She was prepped and draped in the usual sterile fashion.  A RUMI uterine manipulator with the Koh cup and a Foley catheter were placed.  A timeout was called and the procedure confirmed.  A 19mm supraumbilical incision was made and a trochar was used to enter the abdomen directly. Three 39mm ports were placed on either side of the umbilicus under direct visualization.  A left upper quadrant 75mm port was  placed under visualization. The pelvic organs were bathed with 60cc Ropivicane.  All port sites were injected with 10cc local anesthetic.    The patient was placed in Trendelenburg position and the [employer] robotic device was docked.  Next, attention was turned to the console where the hysterectomy was performed. The right infundibulopelvic ligament divided and the fallopian tube detached from mesosalpinx and the uteroovarian anastomosis was divided. The right round ligament was then divided.  The adhesions of the uterus to the left pelvic sidewall were taken down and the left round ligament was then divided.  The anterior leaflet of the peritoneum was divided to create a bladder flap. The uterine artery and vein were skeletonized and desiccated superior to the Koh cup bilaterally. Uterine blanching was observed.  A circumferential colpotomy was created along the ridge of the Koh cup. Irrigation was performed. The vaginal cuff was then closed with V-loc suture.  The vaginal occluder was removed from the vagina. Hemostasis confirmed.  Arista was placed atop the vaginal cuff.   The [employer] Vinc[employer] robotic device was undocked and all ports were visualized.  The pneumoperitoneum was reduced completely with the assistance of th ree deep breaths and all ports withdrawn.  The skin was closed with 4-0 Vicryl in subcuticular fashion with skin glue placed atop each port site. The foley catheter was removed. The patient was returned to dorsal supine position, awakened and extubated in the OR having appeared to tolerate the procedure well.  All sponge, needle, and instrument counts were correct x 2 at the end of the case.  Disposition:  PACU  [name], DO

## 2020-08-25 NOTE — Interval H&P Note (Signed)
History and Physical Interval Note:  08/25/2020 11:47 AM  Jody Morton  has presented today for surgery, with the diagnosis of N93.9 Abnormal Uterine Bleeding.  The various methods of treatment have been discussed with the patient and family. After consideration of risks, benefits and other options for treatment, the patient has consented to  Procedure(s): XI ROBOTIC ASSISTED TOTAL HYSTERECTOMY WITH BILATERAL SALPINGO OOPHORECTOMY (Bilateral) (Right oophorectomy, patient is s/p left oophorectomy). as a surgical intervention.  The patient's history has been reviewed, patient examined, no change in status, stable for surgery.  I have reviewed the patient's chart and labs.  Questions were answered to the patient's satisfaction.     [name]

## 2020-08-25 NOTE — Transfer of Care (Signed)
Immediate Anesthesia Transfer of Care Note  Patient: Genavie Boettger  Procedure(s) Performed: Procedure(s) (LRB): XI ROBOTIC ASSISTED TOTAL HYSTERECTOMY WITH BILATERAL SALPINGECTOMY, RIGHT OOPHERECTOMY (N/A)  Patient Location: PACU  Anesthesia Type: General  Level of Consciousness: sleepy  Airway & Oxygen Therapy: Patient Spontanous Breathing and Patient connected to nasal cannula oxygen  Post-op Assessment: Report given to PACU RN and Post -op Vital signs reviewed and stable  Post vital signs: Reviewed and stable  Complications: No apparent anesthesia complications Last Vitals:  Vitals Value Taken Time  BP 112/73 08/25/20 1515  Temp 36.3 C 08/25/20 1513  Pulse 89 08/25/20 1517  Resp 15 08/25/20 1517  SpO2 99 % 08/25/20 1517  Vitals shown include unvalidated device data.  Last Pain:  Vitals:   08/25/20 1513  TempSrc:   PainSc: 0-No pain      Patients Stated Pain Goal: 6 (58/00/63 4949)  Complications: No complications documented.

## 2020-08-25 NOTE — Anesthesia Preprocedure Evaluation (Signed)
Anesthesia Evaluation  Patient identified by MRN, date of birth, ID band Patient awake    Reviewed: Allergy & Precautions, NPO status , Patient's Chart, lab work & pertinent test results  Airway Mallampati: III  TM Distance: >3 FB Neck ROM: Full    Dental  (+) Dental Advisory Given   Pulmonary sleep apnea and Continuous Positive Airway Pressure Ventilation ,    breath sounds clear to auscultation       Cardiovascular hypertension, Pt. on medications  Rhythm:Regular Rate:Normal     Neuro/Psych negative neurological ROS     GI/Hepatic negative GI ROS, Neg liver ROS,   Endo/Other  negative endocrine ROS  Renal/GU negative Renal ROS     Musculoskeletal   Abdominal   Peds  Hematology negative hematology ROS (+)   Anesthesia Other Findings   Reproductive/Obstetrics                             Anesthesia Physical Anesthesia Plan  ASA: III  Anesthesia Plan: General   Post-op Pain Management:    Induction: Intravenous  PONV Risk Score and Plan: 3 and Midazolam, Scopolamine patch - Pre-op, Dexamethasone, Ondansetron and Treatment may vary due to age or medical condition  Airway Management Planned: Oral ETT  Additional Equipment: None  Intra-op Plan:   Post-operative Plan: Extubation in OR  Informed Consent: I have reviewed the patients History and Physical, chart, labs and discussed the procedure including the risks, benefits and alternatives for the proposed anesthesia with the patient or authorized representative who has indicated his/her understanding and acceptance.     Dental advisory given  Plan Discussed with: CRNA  Anesthesia Plan Comments:         Anesthesia Quick Evaluation

## 2020-08-25 NOTE — Anesthesia Procedure Notes (Signed)
Procedure Name: Intubation Date/Time: 08/25/2020 12:09 PM Performed by: Mechele Claude, CRNA Pre-anesthesia Checklist: Patient identified, Emergency Drugs available, Suction available and Patient being monitored Patient Re-evaluated:Patient Re-evaluated prior to induction Oxygen Delivery Method: Circle system utilized Preoxygenation: Pre-oxygenation with 100% oxygen Induction Type: IV induction Ventilation: Mask ventilation without difficulty Laryngoscope Size: Mac and 3 Grade View: Grade I Tube type: Oral Tube size: 7.0 mm Number of attempts: 1 Airway Equipment and Method: Stylet and Oral airway Placement Confirmation: ETT inserted through vocal cords under direct vision,  positive ETCO2 and breath sounds checked- equal and bilateral Secured at: 21 cm Tube secured with: Tape Dental Injury: Teeth and Oropharynx as per pre-operative assessment

## 2020-08-26 DIAGNOSIS — N939 Abnormal uterine and vaginal bleeding, unspecified: Secondary | ICD-10-CM | POA: Diagnosis not present

## 2020-08-26 LAB — SURGICAL PATHOLOGY

## 2020-08-26 MED ORDER — OXYCODONE HCL 5 MG PO TABS
ORAL_TABLET | ORAL | Status: AC
  Filled 2020-08-26: qty 1

## 2020-08-26 MED ORDER — IBUPROFEN 800 MG PO TABS: 800.0000 mg | ORAL_TABLET | Freq: Three times a day (TID) | ORAL | 0 refills | Status: AC | PRN

## 2020-08-26 MED ORDER — OXYCODONE HCL 5 MG PO TABS: 5.0000 mg | ORAL_TABLET | ORAL | 0 refills | Status: AC | PRN

## 2020-08-26 MED ORDER — IBUPROFEN 200 MG PO TABS
ORAL_TABLET | ORAL | Status: AC
  Filled 2020-08-26: qty 1

## 2020-08-26 NOTE — Discharge Summary (Signed)
Physician Discharge Summary  Patient ID: Jody Morton MRN: [id number] DOB/AGE: 1966/03/26 55 y.o.  Admit date: 08/25/2020 Discharge date: 08/26/2020  Admission Diagnoses: Abnormal uterine bleeding  Discharge Diagnoses:  Active Problems:   Abnormal uterine bleeding (AUB)   Obesity   History of left salpingo-oophorectomy   Hypertension  Procedure:  Robotic Assisted Total Laparoscopic Hysterectomy with Right Salpingo-oophorectomy  Discharged Condition: stable  Hospital Course: Patient had procedure as named above and had an unremarkable post-operative course.  She was discharged home the morning after surgery.  Consults: None  Significant Diagnostic Studies: None  Treatments: surgery: As listed above  Discharge Exam: Blood pressure 99/73, pulse 100, temperature 98.9 F (37.2 C), resp. rate 16, height 5\' 4"  (1.626 m), weight 113.3 kg, last menstrual period 07/06/2020, SpO2 93 %. General appearance: alert, cooperative and appears stated age Resp: Normal work of breathing GI: Soft, non-distended, lap sites unremarkable Extremities: no edema, redness or tenderness in the calves or thighs  Disposition: Discharge disposition: 01-Home or Self Care       Discharge Instructions    Discharge patient   Complete by: As directed    Discharge disposition: 01-Home or Self Care   Discharge patient date: 08/26/2020     Allergies as of 08/26/2020      Reactions   Tetracyclines & Related Nausea And Vomiting      Medication List    STOP taking these medications   medroxyPROGESTERone 10 MG tablet Commonly known as: PROVERA     TAKE these medications   ibuprofen 800 MG tablet Commonly known as: ADVIL Take 1 tablet (800 mg total) by mouth every 8 (eight) hours as needed.   lisinopril 5 MG tablet Commonly known as: ZESTRIL Take 5 mg by mouth daily.   multivitamin with minerals Tabs tablet Take 1 tablet by mouth daily. Women's Multivitamin   oxyCODONE 5 MG immediate  release tablet Commonly known as: Oxy IR/ROXICODONE Take 1 tablet (5 mg total) by mouth every 4 (four) hours as needed for up to 5 days for moderate pain.       Follow-up Information    [name], DO Follow up in 3 week(s).   Specialty: Obstetrics and Gynecology Why: 3 week post-operative follow-up. Contact information: [address] [place] [address] [phone]               Signed: [name] 08/26/2020, 6:59 AM

## 2020-08-26 NOTE — Progress Notes (Signed)
GYN Progress Note  Subjective:  Patient feeling well.  Pain is well-controlled.  Has ambulated and voided.  Tolerating PO without N/V. Passing flatus.  Feels ready to go home today.  Denies fevers, chills, chest pain, visual changes, SOB, N/V, or bilateral LE edema.  Objective: BP 99/73 (BP Location: Right Arm)   Pulse 100   Temp 98.9 F (37.2 C)   Resp 16   Ht 5\' 4"  (1.626 m)   Wt 113.3 kg   LMP 07/06/2020   SpO2 93%   BMI 42.88 kg/m  Gen:  NAD, pleasant Pulm:  No increased WOB Abd:  Soft, non-distended, appropriately tender to palpation, no rebound/guarding, laparoscopic port sites c/d/i with skin glue atop each incision Ext:  No bilateral LE edema  A/P: POD#1 RA-TLH/RSO for AUB.  - Doing well post-operatively - Pain well-controlled - Regular diet - Spontaneous voids - Has ambulated and tolerating regular diet - Ready for D/C home today - Discharge instructions reviewed  Dispo:  D/C home today.  [name], DO

## 2020-08-26 NOTE — Anesthesia Postprocedure Evaluation (Signed)
Anesthesia Post Note  Patient: Jody Morton  Procedure(s) Performed: XI ROBOTIC ASSISTED TOTAL HYSTERECTOMY WITH BILATERAL SALPINGECTOMY, RIGHT OOPHERECTOMY (N/A Abdomen)     Patient location during evaluation: PACU Anesthesia Type: General Level of consciousness: awake and alert Pain management: pain level controlled Vital Signs Assessment: post-procedure vital signs reviewed and stable Respiratory status: spontaneous breathing, nonlabored ventilation, respiratory function stable and patient connected to nasal cannula oxygen Cardiovascular status: blood pressure returned to baseline and stable Postop Assessment: no apparent nausea or vomiting Anesthetic complications: no   No complications documented.  Last Vitals:  Vitals:   08/26/20 0615 08/26/20 0756  BP: 99/73 133/81  Pulse: 100 (!) 103  Resp:  16  Temp: 37.2 C 36.9 C  SpO2: 93% 95%    Last Pain:  Vitals:   08/26/20 1017  TempSrc:   PainSc: 3                  Tiajuana Amass

## 2020-08-27 ENCOUNTER — Encounter (HOSPITAL_BASED_OUTPATIENT_CLINIC_OR_DEPARTMENT_OTHER): Payer: Self-pay | Admitting: Obstetrics and Gynecology

## 2020-08-27 LAB — TYPE AND SCREEN
ABO/RH(D): A POS
Antibody Screen: NEGATIVE
PT AG Type: NEGATIVE
Unit division: 0
Unit division: 0

## 2020-08-27 LAB — BPAM RBC
Blood Product Expiration Date: 202203142359
Blood Product Expiration Date: 202203152359
Unit Type and Rh: 5100
Unit Type and Rh: 5100

## 2020-12-06 ENCOUNTER — Other Ambulatory Visit: Payer: Self-pay | Admitting: Physician Assistant

## 2020-12-06 DIAGNOSIS — Z1231 Encounter for screening mammogram for malignant neoplasm of breast: Secondary | ICD-10-CM

## 2021-02-16 DIAGNOSIS — Z1231 Encounter for screening mammogram for malignant neoplasm of breast: Secondary | ICD-10-CM

## 2021-04-13 ENCOUNTER — Other Ambulatory Visit: Payer: Self-pay

## 2021-04-13 ENCOUNTER — Ambulatory Visit
Admission: RE | Admit: 2021-04-13 | Discharge: 2021-04-13 | Disposition: A | Discharge status: Home / Self Care | Payer: BLUE CROSS/BLUE SHIELD | Source: Ambulatory Visit | Attending: Physician Assistant | Admitting: Physician Assistant

## 2021-04-13 DIAGNOSIS — Z1231 Encounter for screening mammogram for malignant neoplasm of breast: Secondary | ICD-10-CM

## 2021-09-14 IMAGING — MG DIGITAL SCREENING BILAT W/ TOMO W/ CAD
8 series · 8 of 24 positions shown · non-contrast
Comparison: None.

CLINICAL DATA: Screening.

EXAM:
DIGITAL SCREENING BILATERAL MAMMOGRAM WITH TOMO AND CAD

[R MLO synth-2D]
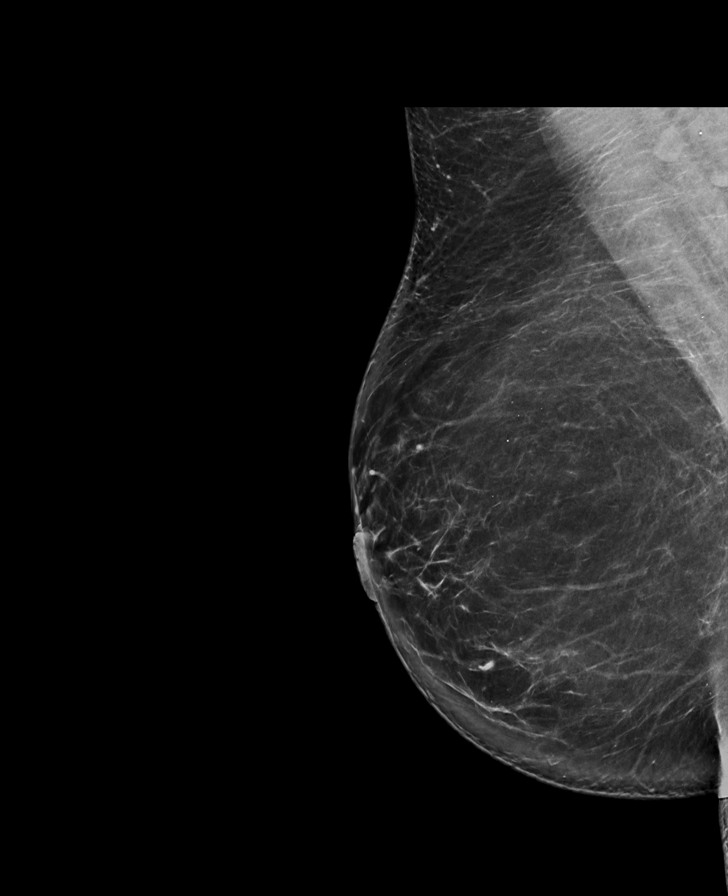

[R CC synth-2D]
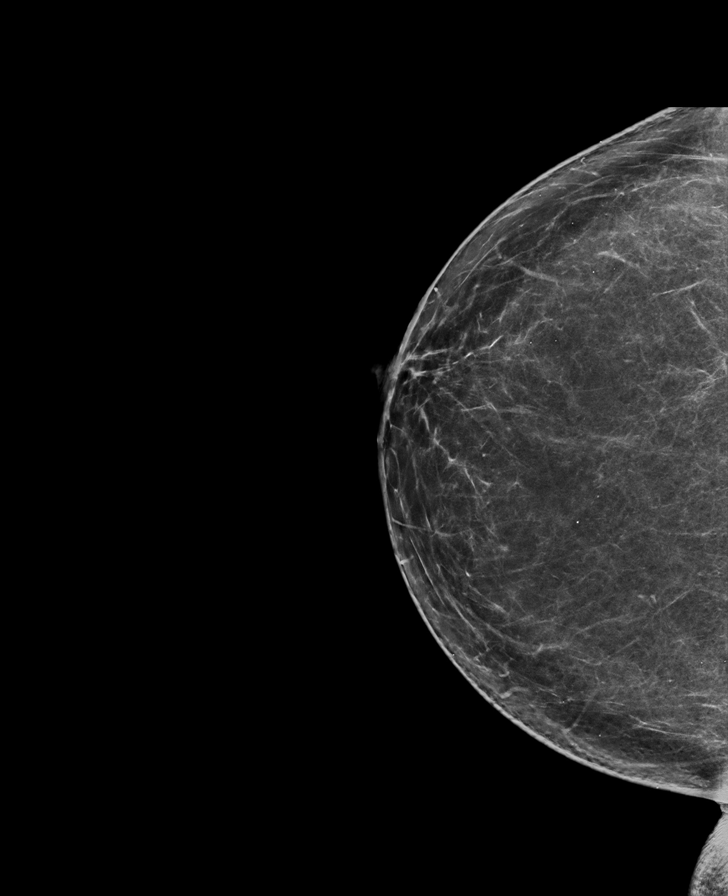

[L CC synth-2D]
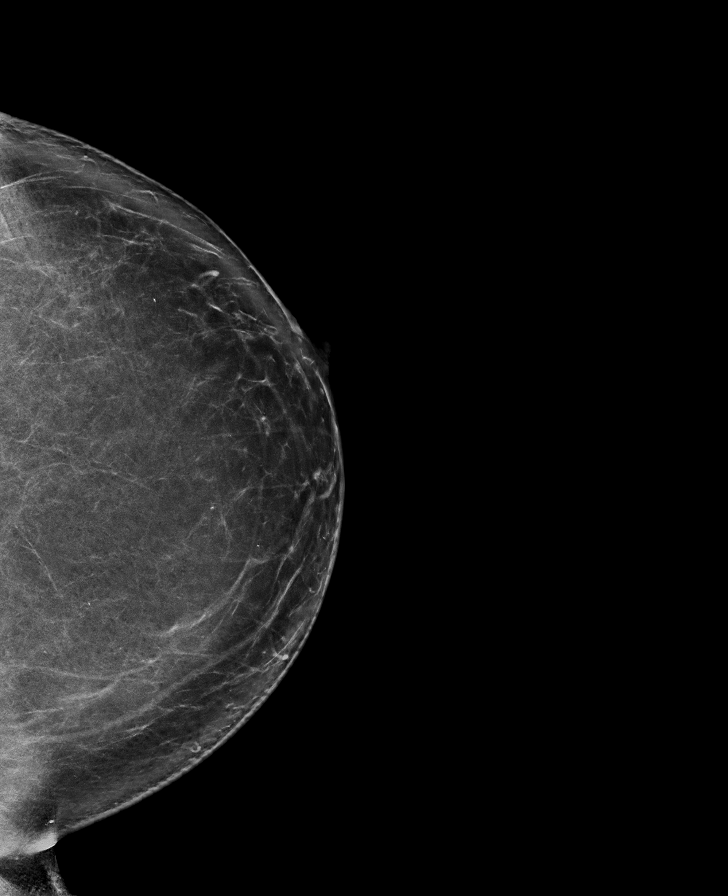

[L MLO synth-2D]
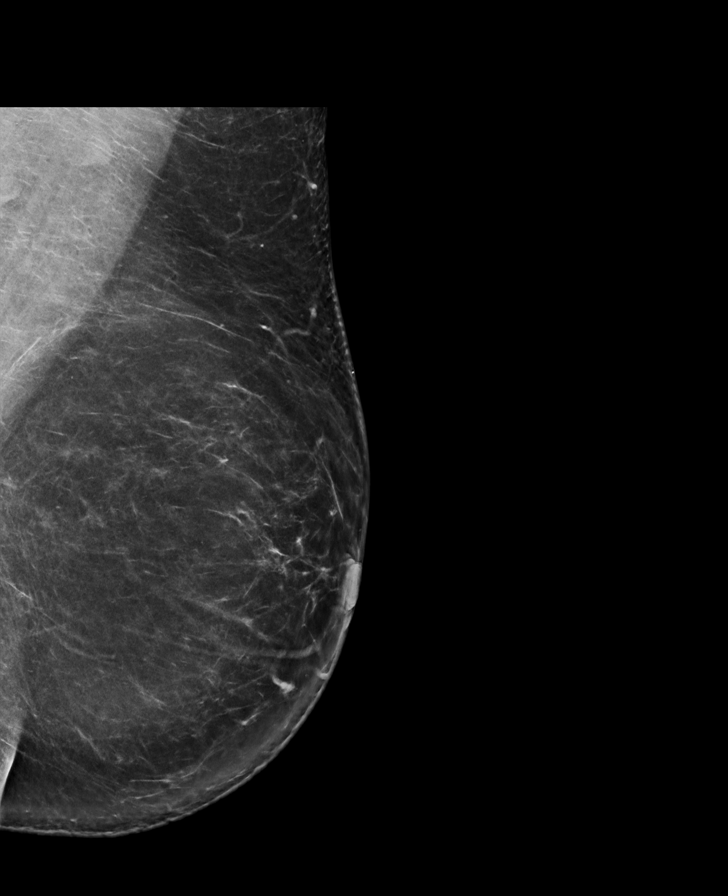

[L MLO tomo · tomo slice 51/101.0]
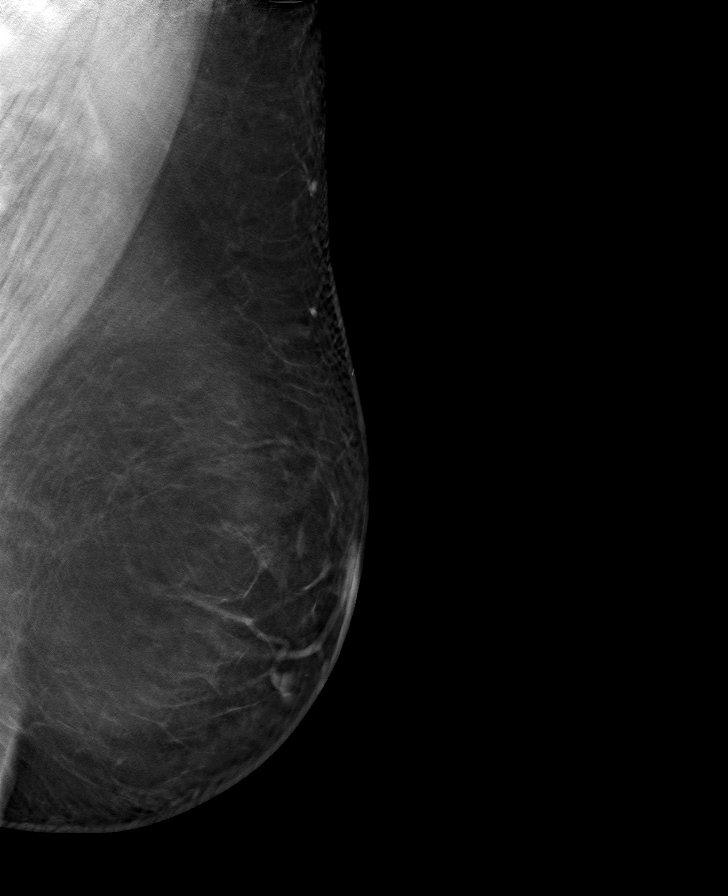

[L CC tomo · tomo slice 49/96.0]
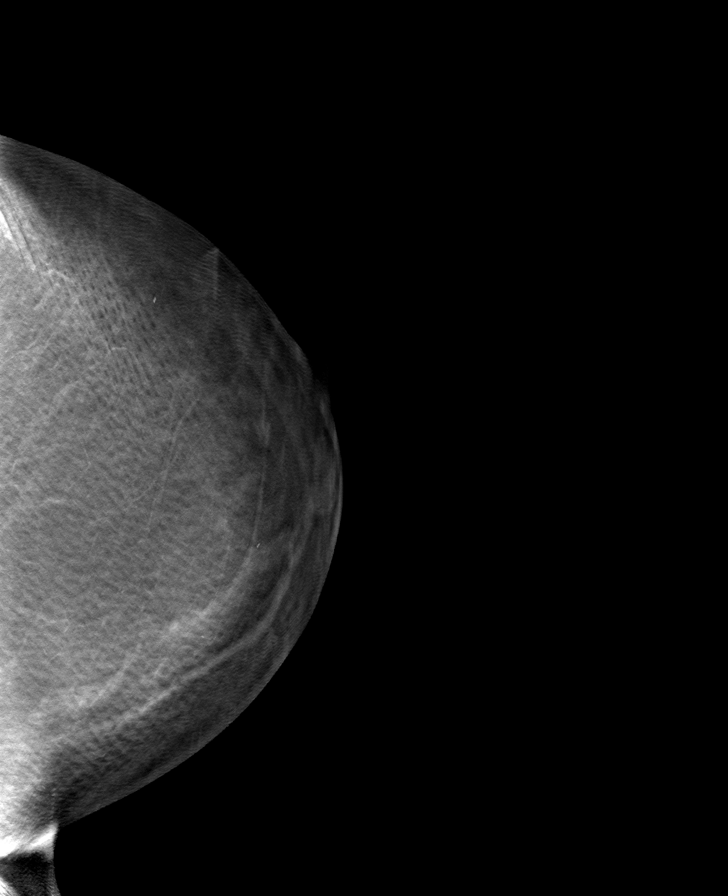

[R CC tomo · tomo slice 42/83.0]
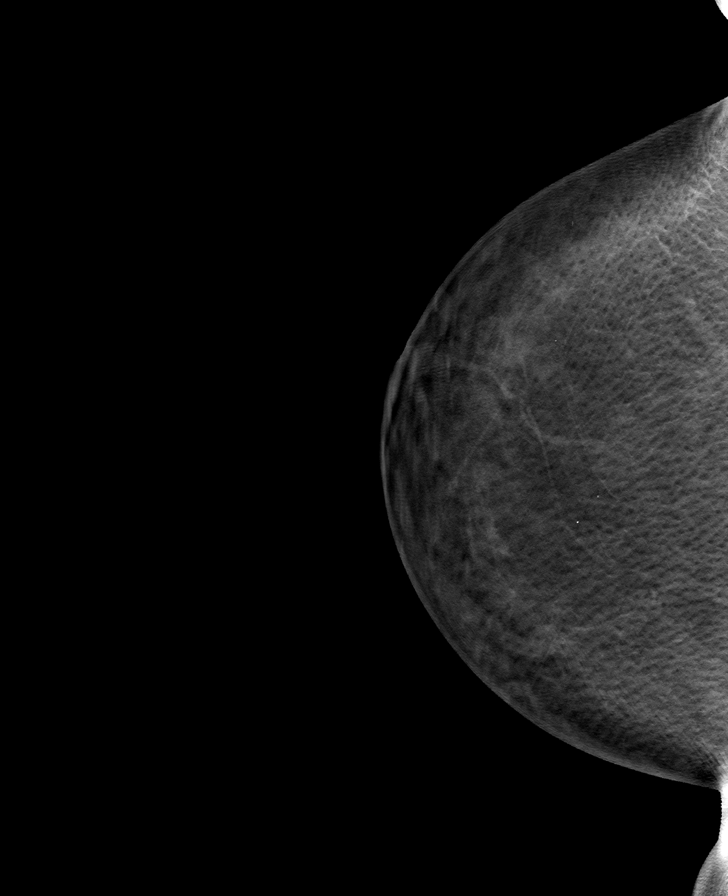

[R MLO tomo · tomo slice 49/97.0]
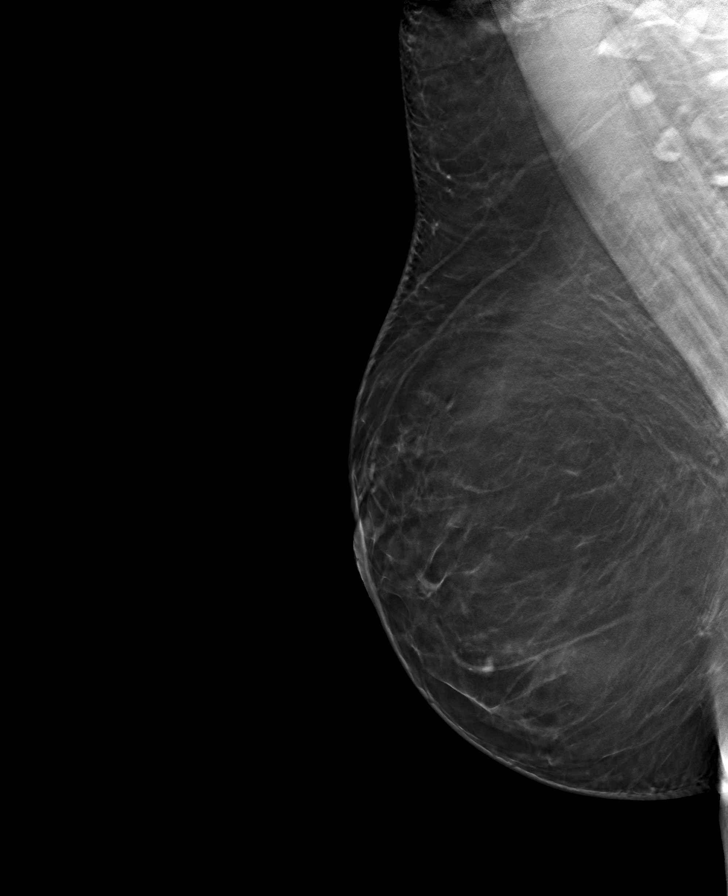

[8 of 24 positions shown; findings below may reference images not displayed]

ACR Breast Density Category b: There are scattered areas of
fibroglandular density.
FINDINGS: There are no findings suspicious for malignancy. Images were
processed with CAD.
IMPRESSION: No mammographic evidence of malignancy. A result letter of this
screening mammogram will be mailed directly to the patient.

RECOMMENDATION:
Screening mammogram in one year. (Code:Y5-G-EJ6)

BI-RADS CATEGORY  1: Negative.

## 2022-03-15 ENCOUNTER — Other Ambulatory Visit: Payer: Self-pay | Admitting: Family Medicine

## 2022-03-15 DIAGNOSIS — Z1231 Encounter for screening mammogram for malignant neoplasm of breast: Secondary | ICD-10-CM

## 2022-04-14 ENCOUNTER — Ambulatory Visit
Admission: RE | Admit: 2022-04-14 | Discharge: 2022-04-14 | Disposition: A | Discharge status: Home / Self Care | Payer: BLUE CROSS/BLUE SHIELD | Source: Ambulatory Visit | Attending: Family Medicine | Admitting: Family Medicine

## 2022-04-14 DIAGNOSIS — Z1231 Encounter for screening mammogram for malignant neoplasm of breast: Secondary | ICD-10-CM

## 2022-12-12 IMAGING — MG MM DIGITAL SCREENING BILAT W/ TOMO AND CAD
8 series · 8 of 24 positions shown · non-contrast
Comparison: Previous exam(s).

CLINICAL DATA: Screening.

EXAM:
DIGITAL SCREENING BILATERAL MAMMOGRAM WITH TOMOSYNTHESIS AND CAD
TECHNIQUE: Bilateral screening digital craniocaudal and mediolateral oblique
mammograms were obtained. Bilateral screening digital breast
tomosynthesis was performed. The images were evaluated with
computer-aided detection.

[R MLO synth-2D]
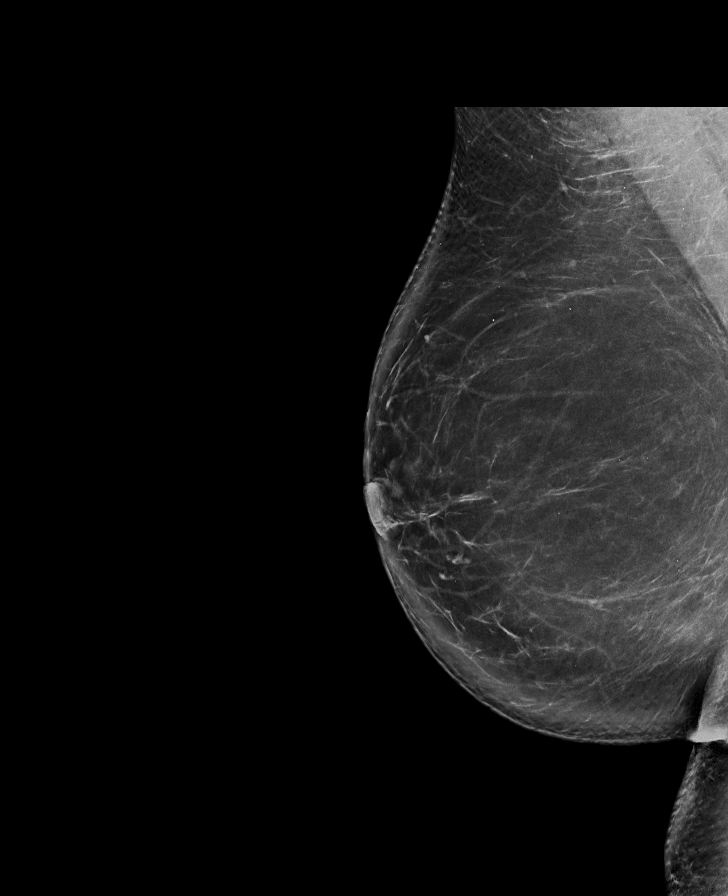

[L CC synth-2D]
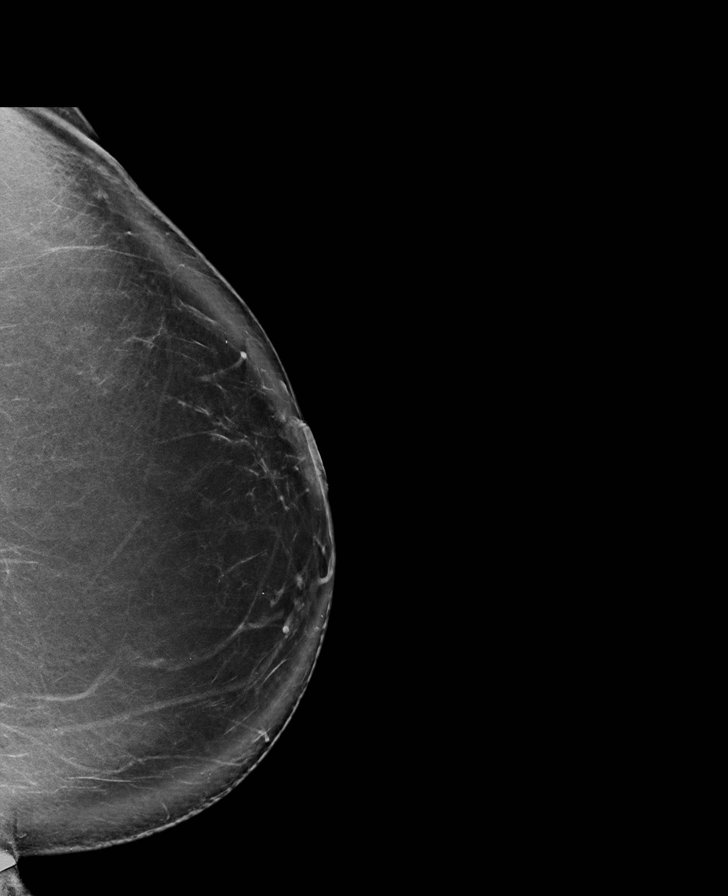

[R CC synth-2D]
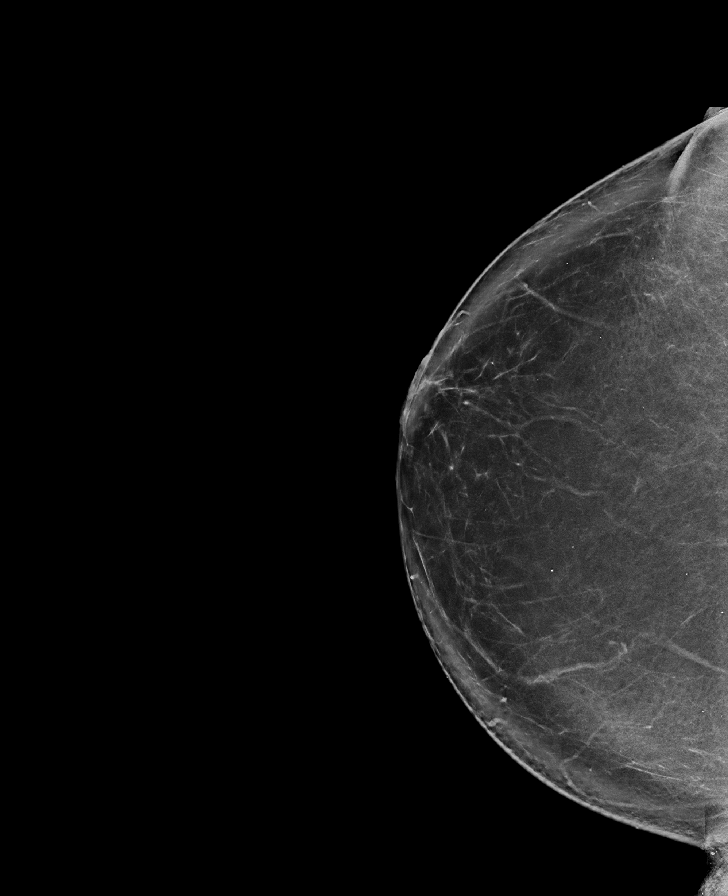

[L MLO synth-2D]
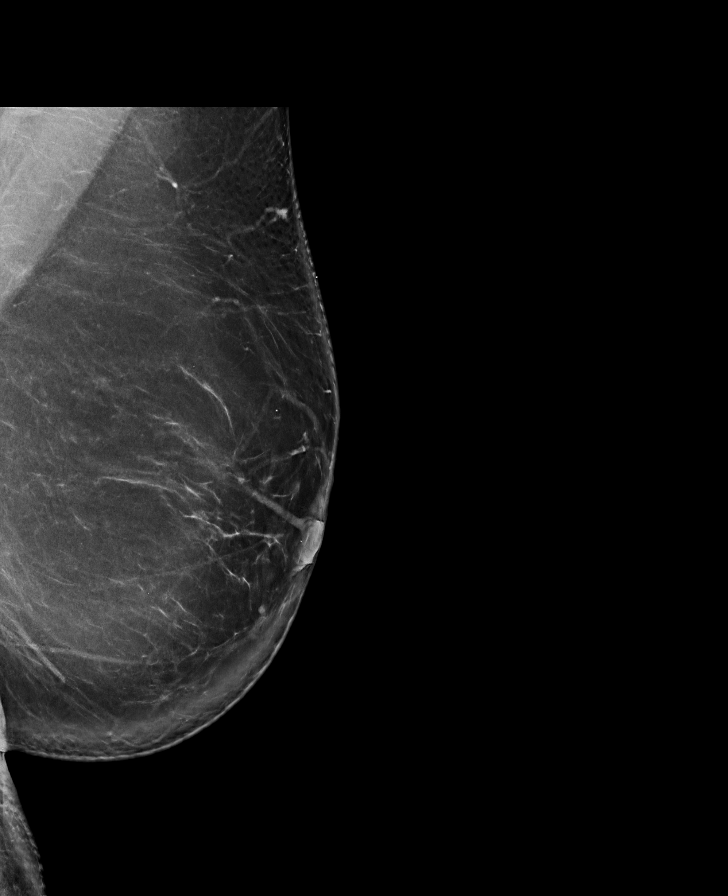

[R MLO tomo · tomo slice 55/109.0]
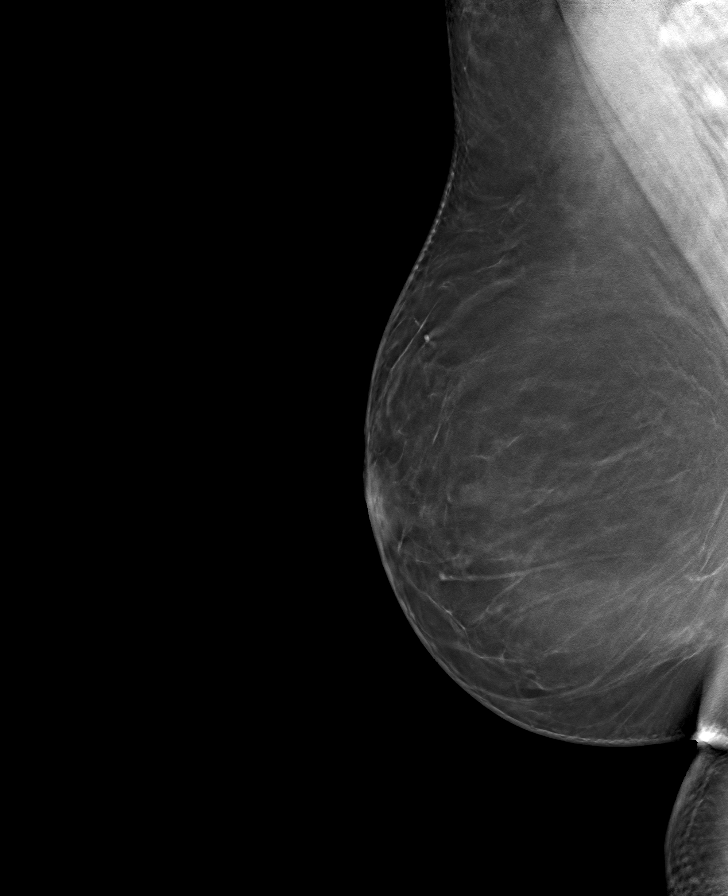

[L CC tomo · tomo slice 58/115.0]
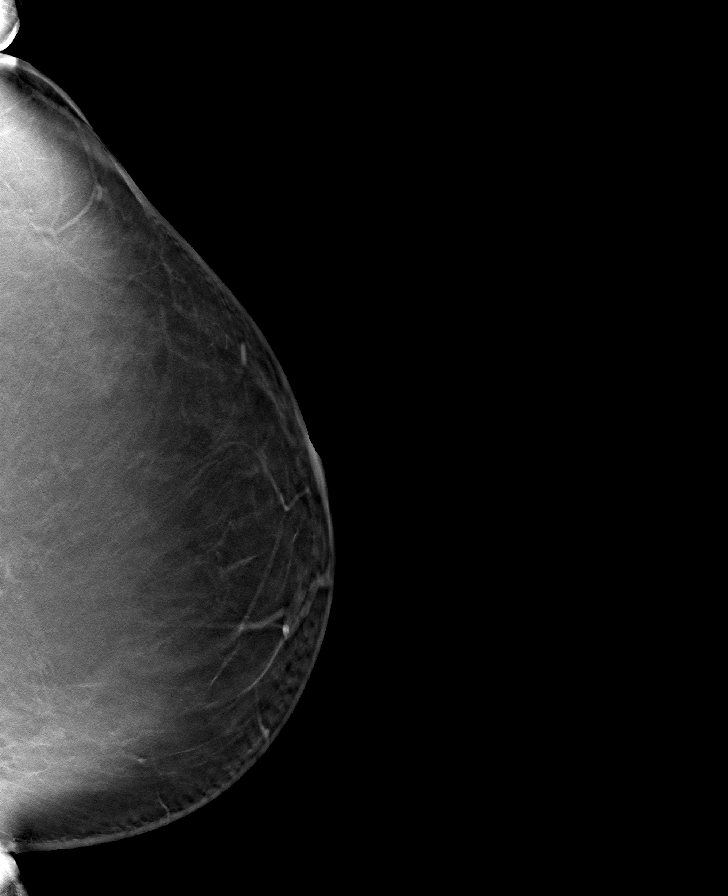

[L MLO tomo · tomo slice 57/114.0]
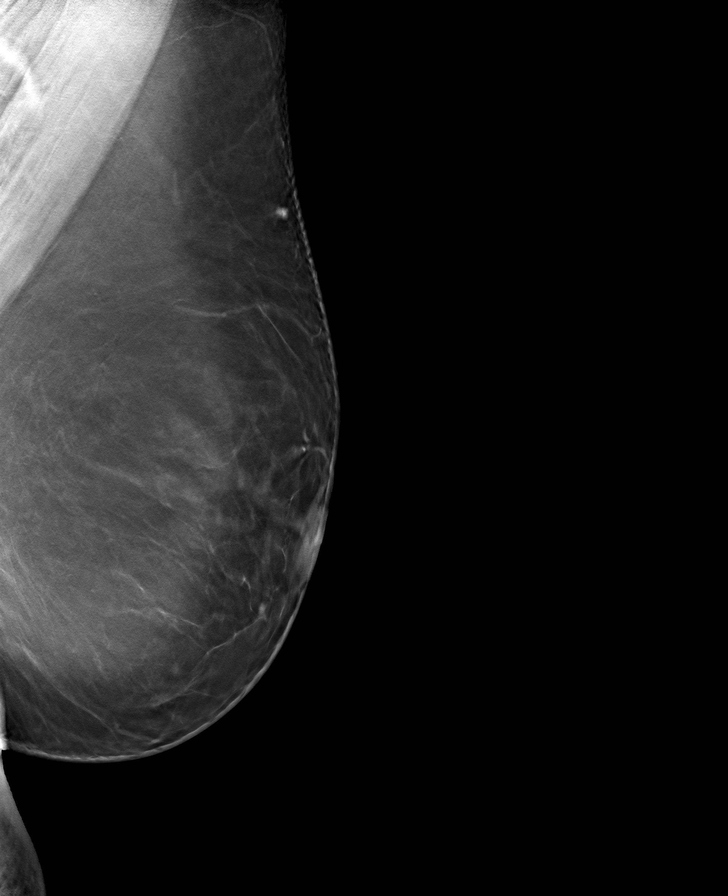

[R CC tomo · tomo slice 51/101.0]
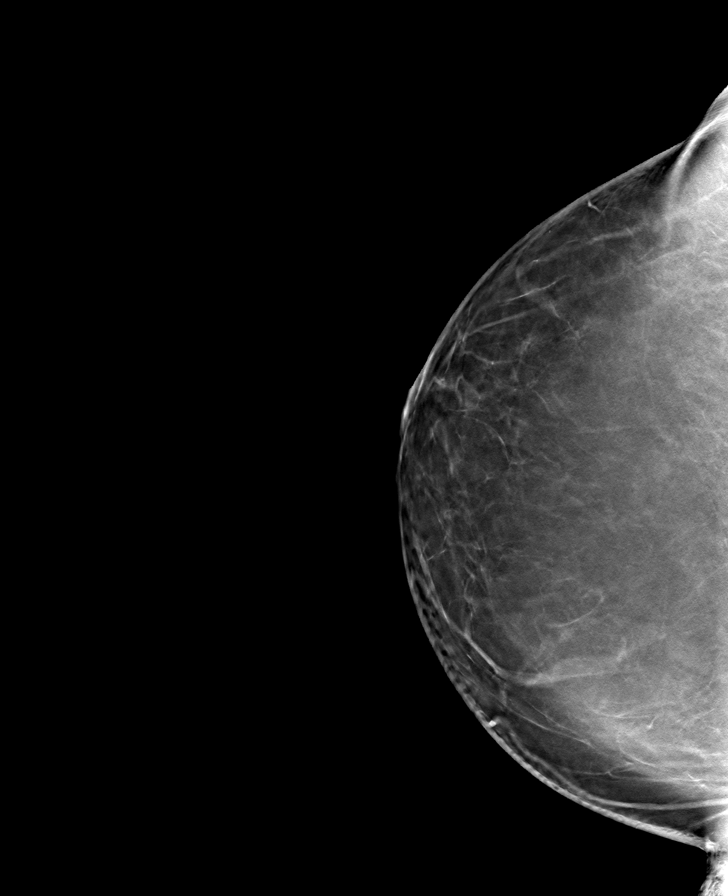

[8 of 24 positions shown; findings below may reference images not displayed]

ACR Breast Density Category b: There are scattered areas of
fibroglandular density.
FINDINGS: There are no findings suspicious for malignancy.
IMPRESSION: No mammographic evidence of malignancy. A result letter of this
screening mammogram will be mailed directly to the patient.

RECOMMENDATION:
Screening mammogram in one year. (Code:51-O-LD2)

BI-RADS CATEGORY  1: Negative.

## 2023-04-25 ENCOUNTER — Other Ambulatory Visit: Payer: Self-pay | Admitting: Family Medicine

## 2023-04-25 DIAGNOSIS — Z0000 Encounter for general adult medical examination without abnormal findings: Secondary | ICD-10-CM

## 2023-05-08 ENCOUNTER — Ambulatory Visit
Admission: RE | Admit: 2023-05-08 | Discharge: 2023-05-08 | Disposition: A | Discharge status: Home / Self Care | Payer: BC Managed Care – PPO | Source: Ambulatory Visit | Attending: Family Medicine | Admitting: Family Medicine

## 2023-05-08 DIAGNOSIS — Z0000 Encounter for general adult medical examination without abnormal findings: Secondary | ICD-10-CM

## 2023-07-26 ENCOUNTER — Other Ambulatory Visit (HOSPITAL_COMMUNITY): Payer: Self-pay

## 2023-07-26 MED ORDER — WEGOVY 1.7 MG/0.75ML ~~LOC~~ SOAJ
1.7000 mg | SUBCUTANEOUS | 1 refills | Status: AC
  Filled 2023-07-26 – 2023-07-30 (×2): qty 3, 28d supply, fill #0
  Filled 2023-08-21 – 2024-02-01 (×14): qty 3, 28d supply, fill #1

## 2023-07-27 ENCOUNTER — Other Ambulatory Visit (HOSPITAL_COMMUNITY): Payer: Self-pay

## 2023-07-30 ENCOUNTER — Other Ambulatory Visit (HOSPITAL_COMMUNITY): Payer: Self-pay

## 2023-07-31 ENCOUNTER — Other Ambulatory Visit (HOSPITAL_COMMUNITY): Payer: Self-pay

## 2023-08-31 ENCOUNTER — Other Ambulatory Visit (HOSPITAL_COMMUNITY): Payer: Self-pay

## 2023-09-13 ENCOUNTER — Other Ambulatory Visit (HOSPITAL_COMMUNITY): Payer: Self-pay

## 2023-09-26 ENCOUNTER — Other Ambulatory Visit (HOSPITAL_COMMUNITY): Payer: Self-pay

## 2023-10-09 ENCOUNTER — Other Ambulatory Visit (HOSPITAL_COMMUNITY): Payer: Self-pay

## 2023-10-22 ENCOUNTER — Other Ambulatory Visit (HOSPITAL_COMMUNITY): Payer: Self-pay

## 2023-11-02 ENCOUNTER — Other Ambulatory Visit (HOSPITAL_COMMUNITY): Payer: Self-pay

## 2023-11-15 ENCOUNTER — Other Ambulatory Visit (HOSPITAL_COMMUNITY): Payer: Self-pay

## 2023-11-28 ENCOUNTER — Other Ambulatory Visit (HOSPITAL_COMMUNITY): Payer: Self-pay

## 2023-12-11 ENCOUNTER — Other Ambulatory Visit (HOSPITAL_COMMUNITY): Payer: Self-pay

## 2023-12-24 ENCOUNTER — Other Ambulatory Visit (HOSPITAL_COMMUNITY): Payer: Self-pay

## 2023-12-25 ENCOUNTER — Other Ambulatory Visit (HOSPITAL_COMMUNITY): Payer: Self-pay

## 2024-01-04 ENCOUNTER — Other Ambulatory Visit (HOSPITAL_COMMUNITY): Payer: Self-pay

## 2024-01-15 ENCOUNTER — Other Ambulatory Visit (HOSPITAL_COMMUNITY): Payer: Self-pay

## 2024-01-17 ENCOUNTER — Other Ambulatory Visit (HOSPITAL_COMMUNITY): Payer: Self-pay

## 2024-01-31 ENCOUNTER — Other Ambulatory Visit (HOSPITAL_COMMUNITY): Payer: Self-pay

## 2024-02-12 ENCOUNTER — Other Ambulatory Visit (HOSPITAL_COMMUNITY): Payer: Self-pay

## 2024-04-22 ENCOUNTER — Other Ambulatory Visit: Payer: Self-pay | Admitting: Family Medicine

## 2024-04-22 DIAGNOSIS — Z1231 Encounter for screening mammogram for malignant neoplasm of breast: Secondary | ICD-10-CM

## 2024-05-09 ENCOUNTER — Ambulatory Visit

## 2024-06-09 ENCOUNTER — Ambulatory Visit
Admission: RE | Admit: 2024-06-09 | Discharge: 2024-06-09 | Disposition: A | Discharge status: Home / Self Care | Source: Ambulatory Visit | Attending: Family Medicine | Admitting: Family Medicine

## 2024-06-09 DIAGNOSIS — Z1231 Encounter for screening mammogram for malignant neoplasm of breast: Secondary | ICD-10-CM
